# Patient Record
Sex: Female | Born: 1960 | Race: Black or African American | Hispanic: No | Smoking: Current every day smoker
Health system: Southern US, Community
[De-identification: ages and names within clinical notes are randomized; demographics above are authoritative.]

## PROBLEM LIST (undated history)

## (undated) DIAGNOSIS — T7840XA Allergy, unspecified, initial encounter: Secondary | ICD-10-CM

## (undated) DIAGNOSIS — E785 Hyperlipidemia, unspecified: Secondary | ICD-10-CM

## (undated) DIAGNOSIS — E119 Type 2 diabetes mellitus without complications: Secondary | ICD-10-CM

## (undated) DIAGNOSIS — K859 Acute pancreatitis without necrosis or infection, unspecified: Secondary | ICD-10-CM

## (undated) DIAGNOSIS — I10 Essential (primary) hypertension: Secondary | ICD-10-CM

## (undated) HISTORY — PX: BREAST BIOPSY: SHX20

## (undated) HISTORY — DX: Allergy, unspecified, initial encounter: T78.40XA

---

## 2007-02-19 HISTORY — PX: BREAST BIOPSY: SHX20

## 2013-03-11 ENCOUNTER — Ambulatory Visit: Payer: Self-pay | Admitting: Family Medicine

## 2014-07-18 ENCOUNTER — Emergency Department
Admission: EM | Admit: 2014-07-18 | Discharge: 2014-07-18 | Disposition: A | Discharge status: Home / Self Care | Payer: BLUE CROSS/BLUE SHIELD | Attending: Emergency Medicine | Admitting: Emergency Medicine

## 2014-07-18 ENCOUNTER — Encounter: Payer: Self-pay | Admitting: Emergency Medicine

## 2014-07-18 DIAGNOSIS — M545 Low back pain: Secondary | ICD-10-CM | POA: Diagnosis present

## 2014-07-18 DIAGNOSIS — Z794 Long term (current) use of insulin: Secondary | ICD-10-CM | POA: Insufficient documentation

## 2014-07-18 DIAGNOSIS — Z7982 Long term (current) use of aspirin: Secondary | ICD-10-CM | POA: Diagnosis not present

## 2014-07-18 DIAGNOSIS — M5416 Radiculopathy, lumbar region: Secondary | ICD-10-CM | POA: Diagnosis not present

## 2014-07-18 DIAGNOSIS — Z79899 Other long term (current) drug therapy: Secondary | ICD-10-CM | POA: Diagnosis not present

## 2014-07-18 DIAGNOSIS — E119 Type 2 diabetes mellitus without complications: Secondary | ICD-10-CM | POA: Diagnosis not present

## 2014-07-18 DIAGNOSIS — M5410 Radiculopathy, site unspecified: Secondary | ICD-10-CM

## 2014-07-18 HISTORY — DX: Type 2 diabetes mellitus without complications: E11.9

## 2014-07-18 MED ORDER — TRAMADOL HCL 50 MG PO TABS
50.0000 mg | ORAL_TABLET | Freq: Four times a day (QID) | ORAL | Status: DC | PRN
Start: 2014-07-18 — End: 2018-10-28

## 2014-07-18 MED ORDER — METHYLPREDNISOLONE 4 MG PO TBPK: ORAL_TABLET | ORAL | Status: DC

## 2014-07-18 MED ORDER — METHOCARBAMOL 750 MG PO TABS: 1500.0000 mg | ORAL_TABLET | Freq: Four times a day (QID) | ORAL | Status: DC

## 2014-07-18 NOTE — ED Notes (Signed)
Pt reports that she was stretching on Thursday and has had lower back pain ever since. 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 She states pain is a spasm and that it shoots down her right leg.

## 2014-07-18 NOTE — ED Provider Notes (Signed)
[facility] Emergency Department Provider Note  ____________________________________________  Time seen: Approximately 9:12 AM  I have reviewed the triage vital signs and the nursing notes.   HISTORY  Chief Complaint Back Pain    HPI Felicia Sherman is a 54 y.o. female plan of low back pain for 4 days. Patient states she was doing some stretching movements 4 days ago and has low back pain ever since. 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 Patient stated this pain shooting down her right leg. Patient is rating her pain as a 10 over 10. Patient denies any bowel or bladder dysfunction.   Past Medical History  Diagnosis Date  . Diabetes mellitus without complication     There are no active problems to display for this patient.   Past Surgical History  Procedure Laterality Date  . Cesarean section      X 2    Current Outpatient Rx  Name  Route  Sig  Dispense  Refill  . aspirin 81 MG tablet   Oral   Take 81 mg by mouth daily.         . cloNIDine (CATAPRES) 0.1 MG tablet   Oral   Take 0.1 mg by mouth 2 (two) times daily.         . insulin detemir (LEVEMIR) 100 UNIT/ML injection   Subcutaneous   Inject 40 Units into the skin at bedtime.         Marland Kitchen. lisinopril (PRINIVIL,ZESTRIL) 5 MG tablet   Oral   Take 5 mg by mouth daily.         . metFORMIN (GLUCOPHAGE) 1000 MG tablet   Oral   Take 1,000 mg by mouth 2 (two) times daily with a meal.           Allergies Review of patient's allergies indicates no known allergies.  No family history on file.  Social History History  Substance Use Topics  . Smoking status: Never Smoker   . Smokeless tobacco: Not on file  . Alcohol Use: No    Review of Systems Constitutional: No fever/chills Eyes: No visual changes. ENT: No sore throat. Cardiovascular: Denies chest pain. Respiratory: Denies shortness of breath. Gastrointestinal: No  abdominal pain.  No nausea, no vomiting.  No diarrhea.  No constipation. Genitourinary: Negative for dysuria. Musculoskeletal: Positive for back pain with pleuritic component to the right lower extremity. Skin: Negative for rash. Neurological: Negative for headaches. States pain radiates to her right lower leg. 10-point ROS otherwise negative.  ____________________________________________   PHYSICAL EXAM:  VITAL SIGNS: ED Triage Vitals  Enc Vitals Group     BP 07/18/14 0758 106/51 mmHg     Pulse Rate 07/18/14 0758 81     Resp 07/18/14 0758 18     Temp 07/18/14 0758 98.1 F (36.7 C)     Temp Source 07/18/14 0758 Oral     SpO2 07/18/14 0758 99 %     Weight 07/18/14 0758 225 lb (102.059 kg)     Height 07/18/14 0758 5\' 1"  (1.549 m)     Head Cir --      Peak Flow --      Pain Score 07/18/14 0758 10     Pain Loc --      Pain Edu? --      Excl. in GC? --     Constitutional: Alert and oriented. Well appearing and in no acute distress. The patient  is in a wheelchair stating prolonged walking increases her back pain. Eyes: Conjunctivae are normal. PERRL. EOMI. Head: Atraumatic. Nose: No congestion/rhinnorhea. Mouth/Throat: Mucous membranes are moist.  Oropharynx non-erythematous. Neck: No stridor. No deformity for nuchal range of motion of the neck is nontender palpation. Hematological/Lymphatic/Immunilogical: No cervical lymphadenopathy. Cardiovascular: Normal rate, regular rhythm. Grossly normal heart sounds.  Good peripheral circulation. Respiratory: Normal respiratory effort.  No retractions. Lungs CTAB. Gastrointestinal: Soft and nontender. No distention. No abdominal bruits. No CVA tenderness. Musculoskeletal: History lives on upper extremity for significant standing. There is no spinal deformities. Patient has some moderate guarding between L3 and S1. There was decreased range of motion in all fields. There is a negative straight-leg test. Neurologic:  Normal speech and  language. No gross focal neurologic deficits are appreciated. Speech is normal. No gait instability. Skin:  Skin is warm, dry and intact. No rash noted. Psychiatric: Mood and affect are normal. Speech and behavior are normal.  ____________________________________________   LABS (all labs ordered are listed, but only abnormal results are displayed)  Labs Reviewed - No data to display ____________________________________________  EKG   ____________________________________________  RADIOLOGY   ____________________________________________   PROCEDURES  Procedure(s) performed: None  Critical Care performed: No  ____________________________________________   INITIAL IMPRESSION / ASSESSMENT AND PLAN / ED COURSE  Pertinent labs & imaging results that were available during my care of the patient were reviewed by me and considered in my medical decision making (see chart for details).  Radicular low back pain.   FINAL CLINICAL IMPRESSION(S) / ED DIAGNOSES  Final diagnoses:  Back pain with right-sided radiculopathy      Joni Reining, PA-C 07/18/14 1610  Phineas Semen, MD 07/18/14 [id number]

## 2014-07-18 NOTE — Discharge Instructions (Signed)
Back Pain, Adult Back pain is very common. The pain often gets better over time. The cause of back pain is usually not dangerous. Most people can learn to manage their back pain on their own.  Closely monitor blood sugar while taking Prednisone. HOME CARE   Stay active. Start with short walks on flat ground if you can. Try to walk farther each day.  Do not sit, drive, or stand in one place for more than 30 minutes. Do not stay in bed.  Do not avoid exercise or work. Activity can help your back heal faster.  Be careful when you bend or lift an object. Bend at your knees, keep the object close to you, and do not twist.  Sleep on a firm mattress. Lie on your side, and bend your knees. If you lie on your back, put a pillow under your knees.  Only take medicines as told by your doctor.  Put ice on the injured area.  Put ice in a plastic bag.  Place a towel between your skin and the bag.  Leave the ice on for 15-20 minutes, 03-04 times a day for the first 2 to 3 days. After that, you can switch between ice and heat packs.  Ask your doctor about back exercises or massage.  Avoid feeling anxious or stressed. Find good ways to deal with stress, such as exercise. GET HELP RIGHT AWAY IF:   Your pain does not go away with rest or medicine.  Your pain does not go away in 1 week.  You have new problems.  You do not feel well.  The pain spreads into your legs.  You cannot control when you poop (bowel movement) or pee (urinate).  Your arms or legs feel weak or lose feeling (numbness).  You feel sick to your stomach (nauseous) or throw up (vomit).  You have belly (abdominal) pain.  You feel like you may pass out (faint). MAKE SURE YOU:   Understand these instructions.  Will watch your condition.  Will get help right away if you are not doing well or get worse. Document Released: 07/24/2007 Document Revised: 04/29/2011 Document Reviewed: 06/08/2013 [facility] Patient Information  2015 [employer] [place]. This information is not intended to replace advice given to you by your health care provider. Make sure you discuss any questions you have with your health care provider.

## 2014-09-29 ENCOUNTER — Other Ambulatory Visit: Payer: Self-pay | Admitting: Family Medicine

## 2014-09-29 ENCOUNTER — Other Ambulatory Visit: Payer: Self-pay | Admitting: Internal Medicine

## 2014-09-29 DIAGNOSIS — Z1231 Encounter for screening mammogram for malignant neoplasm of breast: Secondary | ICD-10-CM

## 2014-10-11 ENCOUNTER — Ambulatory Visit
Admission: RE | Admit: 2014-10-11 | Discharge: 2014-10-11 | Disposition: A | Discharge status: Home / Self Care | Payer: BLUE CROSS/BLUE SHIELD | Source: Ambulatory Visit | Attending: Family Medicine | Admitting: Family Medicine

## 2014-10-11 DIAGNOSIS — R921 Mammographic calcification found on diagnostic imaging of breast: Secondary | ICD-10-CM | POA: Insufficient documentation

## 2014-10-11 DIAGNOSIS — Z1231 Encounter for screening mammogram for malignant neoplasm of breast: Secondary | ICD-10-CM | POA: Diagnosis not present

## 2015-10-02 ENCOUNTER — Other Ambulatory Visit: Payer: Self-pay | Admitting: Family Medicine

## 2015-10-02 DIAGNOSIS — Z1231 Encounter for screening mammogram for malignant neoplasm of breast: Secondary | ICD-10-CM

## 2015-10-13 ENCOUNTER — Other Ambulatory Visit: Payer: Self-pay | Admitting: Family Medicine

## 2015-10-13 ENCOUNTER — Ambulatory Visit
Admission: RE | Admit: 2015-10-13 | Discharge: 2015-10-13 | Disposition: A | Discharge status: Home / Self Care | Payer: 59 | Source: Ambulatory Visit | Attending: Family Medicine | Admitting: Family Medicine

## 2015-10-13 DIAGNOSIS — Z1231 Encounter for screening mammogram for malignant neoplasm of breast: Secondary | ICD-10-CM | POA: Diagnosis not present

## 2015-10-13 DIAGNOSIS — R928 Other abnormal and inconclusive findings on diagnostic imaging of breast: Secondary | ICD-10-CM | POA: Diagnosis not present

## 2015-10-19 ENCOUNTER — Other Ambulatory Visit: Payer: Self-pay | Admitting: Family Medicine

## 2015-10-20 ENCOUNTER — Other Ambulatory Visit: Payer: Self-pay | Admitting: Family Medicine

## 2015-10-20 DIAGNOSIS — R928 Other abnormal and inconclusive findings on diagnostic imaging of breast: Secondary | ICD-10-CM

## 2015-10-20 DIAGNOSIS — R921 Mammographic calcification found on diagnostic imaging of breast: Secondary | ICD-10-CM

## 2015-11-07 ENCOUNTER — Ambulatory Visit
Admission: RE | Admit: 2015-11-07 | Discharge: 2015-11-07 | Disposition: A | Discharge status: Home / Self Care | Payer: 59 | Source: Ambulatory Visit | Attending: Family Medicine | Admitting: Family Medicine

## 2015-11-07 DIAGNOSIS — R928 Other abnormal and inconclusive findings on diagnostic imaging of breast: Secondary | ICD-10-CM | POA: Diagnosis present

## 2015-11-07 DIAGNOSIS — R921 Mammographic calcification found on diagnostic imaging of breast: Secondary | ICD-10-CM | POA: Insufficient documentation

## 2015-11-16 ENCOUNTER — Other Ambulatory Visit: Payer: Self-pay | Admitting: Family Medicine

## 2015-11-16 DIAGNOSIS — R921 Mammographic calcification found on diagnostic imaging of breast: Secondary | ICD-10-CM

## 2015-11-23 ENCOUNTER — Ambulatory Visit
Admission: RE | Admit: 2015-11-23 | Discharge: 2015-11-23 | Disposition: A | Discharge status: Home / Self Care | Payer: 59 | Source: Ambulatory Visit | Attending: Family Medicine | Admitting: Family Medicine

## 2015-11-23 DIAGNOSIS — R921 Mammographic calcification found on diagnostic imaging of breast: Secondary | ICD-10-CM | POA: Diagnosis present

## 2015-11-23 DIAGNOSIS — R920 Mammographic microcalcification found on diagnostic imaging of breast: Secondary | ICD-10-CM | POA: Diagnosis not present

## 2015-11-23 HISTORY — PX: BREAST BIOPSY: SHX20

## 2015-11-24 LAB — SURGICAL PATHOLOGY

## 2016-08-23 ENCOUNTER — Emergency Department
Admission: EM | Admit: 2016-08-23 | Discharge: 2016-08-23 | Disposition: A | Discharge status: Home / Self Care | Payer: Commercial Managed Care - HMO | Attending: Emergency Medicine | Admitting: Emergency Medicine

## 2016-08-23 ENCOUNTER — Emergency Department: Payer: Commercial Managed Care - HMO

## 2016-08-23 ENCOUNTER — Encounter: Payer: Self-pay | Admitting: Medical Oncology

## 2016-08-23 DIAGNOSIS — Z7984 Long term (current) use of oral hypoglycemic drugs: Secondary | ICD-10-CM | POA: Insufficient documentation

## 2016-08-23 DIAGNOSIS — Z79899 Other long term (current) drug therapy: Secondary | ICD-10-CM | POA: Diagnosis not present

## 2016-08-23 DIAGNOSIS — Z794 Long term (current) use of insulin: Secondary | ICD-10-CM | POA: Insufficient documentation

## 2016-08-23 DIAGNOSIS — Z7982 Long term (current) use of aspirin: Secondary | ICD-10-CM | POA: Diagnosis not present

## 2016-08-23 DIAGNOSIS — R1013 Epigastric pain: Secondary | ICD-10-CM | POA: Diagnosis present

## 2016-08-23 DIAGNOSIS — K8500 Idiopathic acute pancreatitis without necrosis or infection: Secondary | ICD-10-CM | POA: Diagnosis not present

## 2016-08-23 DIAGNOSIS — E119 Type 2 diabetes mellitus without complications: Secondary | ICD-10-CM | POA: Insufficient documentation

## 2016-08-23 DIAGNOSIS — R112 Nausea with vomiting, unspecified: Secondary | ICD-10-CM | POA: Diagnosis not present

## 2016-08-23 DIAGNOSIS — I10 Essential (primary) hypertension: Secondary | ICD-10-CM | POA: Insufficient documentation

## 2016-08-23 HISTORY — DX: Hyperlipidemia, unspecified: E78.5

## 2016-08-23 HISTORY — DX: Essential (primary) hypertension: I10

## 2016-08-23 HISTORY — DX: Acute pancreatitis without necrosis or infection, unspecified: K85.90

## 2016-08-23 LAB — COMPREHENSIVE METABOLIC PANEL
ALT: 18 U/L (ref 14–54)
ANION GAP: 9 (ref 5–15)
AST: 28 U/L (ref 15–41)
Albumin: 4.3 g/dL (ref 3.5–5.0)
Alkaline Phosphatase: 73 U/L (ref 38–126)
BILIRUBIN TOTAL: 0.9 mg/dL (ref 0.3–1.2)
BUN: 17 mg/dL (ref 6–20)
CO2: 25 mmol/L (ref 22–32)
Calcium: 10.1 mg/dL (ref 8.9–10.3)
Chloride: 104 mmol/L (ref 101–111)
Creatinine, Ser: 1.1 mg/dL — ABNORMAL HIGH (ref 0.44–1.00)
GFR, EST NON AFRICAN AMERICAN: 55 mL/min — AB (ref >60)
Glucose, Bld: 93 mg/dL (ref 65–99)
POTASSIUM: 4.4 mmol/L (ref 3.5–5.1)
Sodium: 138 mmol/L (ref 135–145)
TOTAL PROTEIN: 8.1 g/dL (ref 6.5–8.1)

## 2016-08-23 LAB — URINALYSIS, COMPLETE (UACMP) WITH MICROSCOPIC
BACTERIA UA: NONE SEEN
Bilirubin Urine: NEGATIVE
GLUCOSE, UA: NEGATIVE mg/dL
HGB URINE DIPSTICK: NEGATIVE
Ketones, ur: 5 mg/dL — AB
LEUKOCYTES UA: NEGATIVE
NITRITE: NEGATIVE
Protein, ur: 100 mg/dL — AB
SPECIFIC GRAVITY, URINE: 1.024 (ref 1.005–1.030)
pH: 5 (ref 5.0–8.0)

## 2016-08-23 LAB — CBC
HCT: 42.5 % (ref 35.0–47.0)
Hemoglobin: 14.3 g/dL (ref 12.0–16.0)
MCH: 29.3 pg (ref 26.0–34.0)
MCHC: 33.6 g/dL (ref 32.0–36.0)
MCV: 87.1 fL (ref 80.0–100.0)
PLATELETS: 272 10*3/uL (ref 150–440)
RBC: 4.87 MIL/uL (ref 3.80–5.20)
RDW: 14.2 % (ref 11.5–14.5)
WBC: 8.9 10*3/uL (ref 3.6–11.0)

## 2016-08-23 LAB — LIPASE, BLOOD: LIPASE: 39 U/L (ref 11–51)

## 2016-08-23 LAB — TROPONIN I: Troponin I: <0.03 ng/mL (ref <0.03)

## 2016-08-23 MED ORDER — IOPAMIDOL (ISOVUE-300) INJECTION 61%
30.0000 mL | Freq: Once | INTRAVENOUS | Status: AC | PRN
  Administered 2016-08-23: 30 mL via ORAL

## 2016-08-23 MED ORDER — MORPHINE SULFATE (PF) 4 MG/ML IV SOLN
4.0000 mg | Freq: Once | INTRAVENOUS | Status: AC
  Administered 2016-08-23: 4 mg via INTRAVENOUS
  Filled 2016-08-23: qty 1

## 2016-08-23 MED ORDER — ONDANSETRON 4 MG PO TBDP: 4.0000 mg | ORAL_TABLET | Freq: Three times a day (TID) | ORAL | 0 refills | Status: AC | PRN

## 2016-08-23 MED ORDER — IOPAMIDOL (ISOVUE-300) INJECTION 61%
100.0000 mL | Freq: Once | INTRAVENOUS | Status: AC | PRN
  Administered 2016-08-23: 100 mL via INTRAVENOUS

## 2016-08-23 MED ORDER — SODIUM CHLORIDE 0.9 % IV BOLUS (SEPSIS)
1000.0000 mL | Freq: Once | INTRAVENOUS | Status: AC
  Administered 2016-08-23: 1000 mL via INTRAVENOUS

## 2016-08-23 NOTE — ED Provider Notes (Signed)
[facility] Emergency Department Provider Note  ____________________________________________  Time seen: Approximately 1:16 PM  I have reviewed the triage vital signs and the nursing notes.   HISTORY  Chief Complaint Abdominal Pain and Emesis    HPI Felicia Sherman is a 56 y.o. female who complains of epigastric pain radiating through to her back for the past 3 days associated with vomiting, worse with eating. No alleviating factors. Rated as severe and sharp. Feels just like pancreatitis that she's had in the past. She thinks it is because recently for the July 4 holiday she went to a cookout and ate lots of high-fat foods.     Past Medical History:  Diagnosis Date  . Diabetes mellitus without complication (HCC)   . Hyperlipemia   . Hypertension   . Pancreatitis      There are no active problems to display for this patient.    Past Surgical History:  Procedure Laterality Date  . BREAST BIOPSY Left    2009 benign  . BREAST BIOPSY Right    unknown  . CESAREAN SECTION     X 2     Prior to Admission medications   Medication Sig Start Date End Date Taking? Authorizing Provider  aspirin 81 MG tablet Take 81 mg by mouth daily.   Yes [provider]  cloNIDine (CATAPRES) 0.1 MG tablet Take 0.1 mg by mouth 2 (two) times daily.   Yes [provider]  insulin aspart (NOVOLOG) 100 UNIT/ML injection Inject 10-16 Units into the skin 3 (three) times daily before meals.   Yes [provider]  insulin detemir (LEVEMIR) 100 UNIT/ML injection Inject 45 Units into the skin at bedtime.    Yes [provider]  lisinopril (PRINIVIL,ZESTRIL) 5 MG tablet Take 5 mg by mouth daily.   Yes [provider]  metFORMIN (GLUCOPHAGE) 1000 MG tablet Take 1,000 mg by mouth 2 (two) times daily with a meal.   Yes [provider]  methocarbamol (ROBAXIN-750) 750 MG tablet Take 2 tablets (1,500 mg total) by mouth 4 (four)  times daily. Patient not taking: Reported on 08/23/2016 07/18/14   Joni Reining, PA-C  methylPREDNISolone (MEDROL DOSEPAK) 4 MG TBPK tablet Take Tapered dose as directed Patient not taking: Reported on 08/23/2016 07/18/14   Joni Reining, PA-C  ondansetron (ZOFRAN ODT) 4 MG disintegrating tablet Take 1 tablet (4 mg total) by mouth every 8 (eight) hours as needed for nausea or vomiting. 08/23/16   Sharman Cheek, MD  traMADol (ULTRAM) 50 MG tablet Take 1 tablet (50 mg total) by mouth every 6 (six) hours as needed for moderate pain. Patient not taking: Reported on 08/23/2016 07/18/14   Joni Reining, PA-C     Allergies Patient has no known allergies.   Family History  Problem Relation Age of Onset  . Breast cancer Maternal Aunt   . Breast cancer Paternal Aunt     Social History Social History  Substance Use Topics  . Smoking status: Never Smoker  . Smokeless tobacco: Not on file  . Alcohol use No    Review of Systems  Constitutional:   No fever or chills.  ENT:   No sore throat. No rhinorrhea. Cardiovascular:   No chest pain or syncope. Respiratory:   No dyspnea or cough. Gastrointestinal:   Positive as above for abdominal pain with vomiting. No constipation or diarrhea..  Musculoskeletal:   Negative for focal pain or swelling All other systems reviewed and are negative except  as documented above in ROS and HPI.  ____________________________________________   PHYSICAL EXAM:  VITAL SIGNS: ED Triage Vitals  Enc Vitals Group     BP 08/23/16 1112 (!) 108/91     Pulse Rate 08/23/16 1112 85     Resp 08/23/16 1112 18     Temp 08/23/16 1112 98.6 F (37 C)     Temp Source 08/23/16 1112 Oral     SpO2 08/23/16 1112 97 %     Weight 08/23/16 1111 225 lb (102.1 kg)     Height --      Head Circumference --      Peak Flow --      Pain Score 08/23/16 1111 8     Pain Loc --      Pain Edu? --      Excl. in GC? --     Vital signs reviewed, nursing assessments  reviewed.   Constitutional:   Alert and oriented. Well appearing and in no distress. Eyes:   No scleral icterus.  EOMI. No nystagmus. No conjunctival pallor. PERRL. ENT   Head:   Normocephalic and atraumatic.   Nose:   No congestion/rhinnorhea.    Mouth/Throat:   MMM, no pharyngeal erythema. No peritonsillar mass.    Neck:   No meningismus. Full ROM Hematological/Lymphatic/Immunilogical:   No cervical lymphadenopathy. Cardiovascular:   RRR. Symmetric bilateral radial and DP pulses.  No murmurs.  Respiratory:   Normal respiratory effort without tachypnea/retractions. Breath sounds are clear and equal bilaterally. No wheezes/rales/rhonchi. Gastrointestinal:   Soft With epigastric tenderness. Non distended. There is no CVA tenderness.  No rebound, rigidity, or guarding. Genitourinary:   deferred Musculoskeletal:   Normal range of motion in all extremities. No joint effusions.  No lower extremity tenderness.  No edema. Neurologic:   Normal speech and language.  Motor grossly intact. No gross focal neurologic deficits are appreciated.  Skin:    Skin is warm, dry and intact. No rash noted.  No petechiae, purpura, or bullae.  ____________________________________________    LABS (pertinent positives/negatives) (all labs ordered are listed, but only abnormal results are displayed) Labs Reviewed  COMPREHENSIVE METABOLIC PANEL - Abnormal; Notable for the following:       Result Value   Creatinine, Ser 1.10 (*)    GFR calc non Af Amer 55 (*)    All other components within normal limits  URINALYSIS, COMPLETE (UACMP) WITH MICROSCOPIC - Abnormal; Notable for the following:    Color, Urine AMBER (*)    APPearance CLOUDY (*)    Ketones, ur 5 (*)    Protein, ur 100 (*)    Squamous Epithelial / LPF 6-30 (*)    All other components within normal limits  LIPASE, BLOOD  CBC  TROPONIN I   ____________________________________________   EKG  Interpreted by me  Date: 08/23/2016   Rate: 71  Rhythm: normal sinus rhythm  QRS Axis: normal  Intervals: normal  ST/T Wave abnormalities: normal  Conduction Disutrbances: none  Narrative Interpretation: unremarkable      ____________________________________________    RADIOLOGY  Ct Abdomen Pelvis W Contrast  Result Date: 08/23/2016 CLINICAL DATA:  Upper abdominal pain for 3 days with vomiting. History of pancreatitis. EXAM: CT ABDOMEN AND PELVIS WITH CONTRAST TECHNIQUE: Multidetector CT imaging of the abdomen and pelvis was performed using the standard protocol following bolus administration of intravenous contrast. CONTRAST:  100mL ISOVUE-300 IOPAMIDOL (ISOVUE-300) INJECTION 61% COMPARISON:  None. FINDINGS: Lower chest: No acute abnormality. Hepatobiliary: No focal liver abnormality is seen.  No gallbladder wall thickening, or biliary dilatation. Probable gallbladder sludge. Pancreas: Futureless masslike appearance of the head of the pancreas, with small amount of peripancreatic fluid. No evidence of pancreatic ductal dilation. Spleen: Normal in size without focal abnormality. Adrenals/Urinary Tract: Adrenal glands are unremarkable. Kidneys are normal, without renal calculi, focal lesion, or hydronephrosis. Bladder is unremarkable. Stomach/Bowel: Stomach is within normal limits. Appendix appears normal. No evidence of bowel wall thickening, distention, or inflammatory changes. Scatter left colonic diverticulosis. Vascular/Lymphatic: Partially calcified soft tissue mass within the right retroperitoneum measures 1.4 by 1.3 by 1.9 cm. Aortic atherosclerosis. Reproductive: Uterus and bilateral adnexa are unremarkable. Other: Fat containing periumbilical anterior abdominal wall hernia. No abdominopelvic ascites. Musculoskeletal: Posterior facet arthropathy in the lower lumbosacral spine. IMPRESSION: Futureless masslike appearance of the head of the pancreas with small amount of peripancreatic fluid. The findings are most consistent with  acute pancreatitis. However if patient's serology does not support acute pancreatitis, pancreatic carcinoma should be considered. Partially calcified soft tissue mass within the right retroperitoneum measuring 1.9 cm in greatest dimension. This may represent a calcified retroperitoneal lymph node, or it may represent a partially calcified mesenteric mass, the differential diagnosis of which includes calcific metastatic implant, carcinoid tumor, chronic sclerosing mesenteritis or amyloidosis. Probable gallbladder sludge. Calcific atherosclerotic disease of the aorta. Electronically Signed   By: Ted Mcalpine M.D.   On: 08/23/2016 14:07    ____________________________________________   PROCEDURES Procedures  ____________________________________________   INITIAL IMPRESSION / ASSESSMENT AND PLAN / ED COURSE  Pertinent labs & imaging results that were available during my care of the patient were reviewed by me and considered in my medical decision making (see chart for details).  Patient presents with epigastric pain concerning for pancreatitis. Exam is not consistent with biliary pathology. Low suspicion for SBO or obstruction. Start with labs IV fluids pain control.  Clinical Course as of Aug 24 1515  Fri Aug 23, 2016  1234 Labs nl. Will get CT.  [PS]  1320 Pt reports symptoms much improved, but exam still shows epigastric tenderness. Awaiting CT.   [PS]    Clinical Course User Index [PS] Sharman Cheek, MD      ----------------------------------------- 3:17 PM on 08/23/2016 -----------------------------------------  CT does show some inflammatory changes at the head of the pancreas consistent with pancreatitis. Small possibility that this reflects malignancy, and there is a second unspecified masslike object in the right retroperitoneum. Results discussed with the patient, offered admission, but patient feels better and wants to go home. She is tolerating oral intake. Plan  to follow closely with primary care and also plan to follow up with gastroenterology for further evaluation of symptoms and CT findings once this acute pancreatitis episode has resolved. ____________________________________________   FINAL CLINICAL IMPRESSION(S) / ED DIAGNOSES  Final diagnoses:  Epigastric pain  Non-intractable vomiting with nausea, unspecified vomiting type  Idiopathic acute pancreatitis, unspecified complication status      New Prescriptions   ONDANSETRON (ZOFRAN ODT) 4 MG DISINTEGRATING TABLET    Take 1 tablet (4 mg total) by mouth every 8 (eight) hours as needed for nausea or vomiting.     Portions of this note were generated with dragon dictation software. Dictation errors may occur despite best attempts at proofreading.    Sharman Cheek, MD 08/23/16 [phone]

## 2016-08-23 NOTE — Discharge Instructions (Signed)
Results for orders placed or performed during the hospital encounter of 08/23/16  Lipase, blood  Result Value Ref Range   Lipase 39 11 - 51 U/L  Comprehensive metabolic panel  Result Value Ref Range   Sodium 138 135 - 145 mmol/L   Potassium 4.4 3.5 - 5.1 mmol/L   Chloride 104 101 - 111 mmol/L   CO2 25 22 - 32 mmol/L   Glucose, Bld 93 65 - 99 mg/dL   BUN 17 6 - 20 mg/dL   Creatinine, Ser 8.29 (H) 0.44 - 1.00 mg/dL   Calcium 56.2 8.9 - 13.0 mg/dL   Total Protein 8.1 6.5 - 8.1 g/dL   Albumin 4.3 3.5 - 5.0 g/dL   AST 28 15 - 41 U/L   ALT 18 14 - 54 U/L   Alkaline Phosphatase 73 38 - 126 U/L   Total Bilirubin 0.9 0.3 - 1.2 mg/dL   GFR calc non Af Amer 55 (L) >60 mL/min   GFR calc Af Amer >60 >60 mL/min   Anion gap 9 5 - 15  CBC  Result Value Ref Range   WBC 8.9 3.6 - 11.0 K/uL   RBC 4.87 3.80 - 5.20 MIL/uL   Hemoglobin 14.3 12.0 - 16.0 g/dL   HCT 86.5 78.4 - 69.6 %   MCV 87.1 80.0 - 100.0 fL   MCH 29.3 26.0 - 34.0 pg   MCHC 33.6 32.0 - 36.0 g/dL   RDW 29.5 28.4 - 13.2 %   Platelets 272 150 - 440 K/uL  Urinalysis, Complete w Microscopic  Result Value Ref Range   Color, Urine AMBER (A) YELLOW   APPearance CLOUDY (A) CLEAR   Specific Gravity, Urine 1.024 1.005 - 1.030   pH 5.0 5.0 - 8.0   Glucose, UA NEGATIVE NEGATIVE mg/dL   Hgb urine dipstick NEGATIVE NEGATIVE   Bilirubin Urine NEGATIVE NEGATIVE   Ketones, ur 5 (A) NEGATIVE mg/dL   Protein, ur 440 (A) NEGATIVE mg/dL   Nitrite NEGATIVE NEGATIVE   Leukocytes, UA NEGATIVE NEGATIVE   RBC / HPF 0-5 0 - 5 RBC/hpf   WBC, UA 0-5 0 - 5 WBC/hpf   Bacteria, UA NONE SEEN NONE SEEN   Squamous Epithelial / LPF 6-30 (A) NONE SEEN   Mucous PRESENT    Hyaline Casts, UA PRESENT   Troponin I  Result Value Ref Range   Troponin I <0.03 <0.03 ng/mL   Ct Abdomen Pelvis W Contrast  Result Date: 08/23/2016 CLINICAL DATA:  Upper abdominal pain for 3 days with vomiting. History of pancreatitis. EXAM: CT ABDOMEN AND PELVIS WITH CONTRAST  TECHNIQUE: Multidetector CT imaging of the abdomen and pelvis was performed using the standard protocol following bolus administration of intravenous contrast. CONTRAST:  ISOVUE-300 IOPAMIDOL (ISOVUE-300) INJECTION 61% COMPARISON:  None. FINDINGS: Lower chest: No acute abnormality. Hepatobiliary: No focal liver abnormality is seen. No gallbladder wall thickening, or biliary dilatation. Probable gallbladder sludge. Pancreas: Futureless masslike appearance of the head of the pancreas, with small amount of peripancreatic fluid. No evidence of pancreatic ductal dilation. Spleen: Normal in size without focal abnormality. Adrenals/Urinary Tract: Adrenal glands are unremarkable. Kidneys are normal, without renal calculi, focal lesion, or hydronephrosis. Bladder is unremarkable. Stomach/Bowel: Stomach is within normal limits. Appendix appears normal. No evidence of bowel wall thickening, distention, or inflammatory changes. Scatter left colonic diverticulosis. Vascular/Lymphatic: Partially calcified soft tissue mass within the right retroperitoneum measures 1.4 by 1.3 by 1.9 cm. Aortic atherosclerosis. Reproductive: Uterus and bilateral adnexa are unremarkable. Other: Fat containing  periumbilical anterior abdominal wall hernia. No abdominopelvic ascites. Musculoskeletal: Posterior facet arthropathy in the lower lumbosacral spine. IMPRESSION: Futureless masslike appearance of the head of the pancreas with small amount of peripancreatic fluid. The findings are most consistent with acute pancreatitis. However if patient's serology does not support acute pancreatitis, pancreatic carcinoma should be considered. Partially calcified soft tissue mass within the right retroperitoneum measuring 1.9 cm in greatest dimension. This may represent a calcified retroperitoneal lymph node, or it may represent a partially calcified mesenteric mass, the differential diagnosis of which includes calcific metastatic implant, carcinoid  tumor, chronic sclerosing mesenteritis or amyloidosis. Probable gallbladder sludge. Calcific atherosclerotic disease of the aorta. Electronically Signed   By: Ted Mcalpineobrinka  Dimitrova M.D.   On: 08/23/2016 14:07

## 2016-08-23 NOTE — ED Triage Notes (Signed)
Pt reports upper abd pain x 3 days with vomiting. Pt reports hx of pancreatitis with feeling exactly like that.

## 2016-08-23 NOTE — ED Notes (Signed)
PO challenge given at this time [facility]

## 2016-10-22 ENCOUNTER — Other Ambulatory Visit: Payer: Self-pay | Admitting: Gastroenterology

## 2016-10-22 DIAGNOSIS — R933 Abnormal findings on diagnostic imaging of other parts of digestive tract: Secondary | ICD-10-CM

## 2016-10-22 DIAGNOSIS — K859 Acute pancreatitis without necrosis or infection, unspecified: Secondary | ICD-10-CM

## 2016-11-01 ENCOUNTER — Other Ambulatory Visit: Payer: Self-pay | Admitting: Gastroenterology

## 2016-11-01 ENCOUNTER — Ambulatory Visit
Admission: RE | Admit: 2016-11-01 | Discharge: 2016-11-01 | Disposition: A | Discharge status: Home / Self Care | Payer: 59 | Source: Ambulatory Visit | Attending: Gastroenterology | Admitting: Gastroenterology

## 2016-11-01 DIAGNOSIS — R933 Abnormal findings on diagnostic imaging of other parts of digestive tract: Secondary | ICD-10-CM | POA: Insufficient documentation

## 2016-11-01 DIAGNOSIS — I700 Atherosclerosis of aorta: Secondary | ICD-10-CM | POA: Diagnosis not present

## 2016-11-01 DIAGNOSIS — K858 Other acute pancreatitis without necrosis or infection: Secondary | ICD-10-CM

## 2016-11-01 DIAGNOSIS — K859 Acute pancreatitis without necrosis or infection, unspecified: Secondary | ICD-10-CM

## 2016-11-01 MED ORDER — GADOBENATE DIMEGLUMINE 529 MG/ML IV SOLN
20.0000 mL | Freq: Once | INTRAVENOUS | Status: AC | PRN
  Administered 2016-11-01: 20 mL via INTRAVENOUS

## 2017-03-25 ENCOUNTER — Other Ambulatory Visit: Payer: Self-pay | Admitting: Family Medicine

## 2017-03-25 DIAGNOSIS — Z1231 Encounter for screening mammogram for malignant neoplasm of breast: Secondary | ICD-10-CM

## 2017-05-20 ENCOUNTER — Ambulatory Visit
Admission: RE | Admit: 2017-05-20 | Discharge: 2017-05-20 | Disposition: A | Discharge status: Home / Self Care | Payer: 59 | Source: Ambulatory Visit | Attending: Family Medicine | Admitting: Family Medicine

## 2017-05-20 DIAGNOSIS — Z1231 Encounter for screening mammogram for malignant neoplasm of breast: Secondary | ICD-10-CM | POA: Diagnosis present

## 2018-01-12 IMAGING — MG MM DIGITAL DIAGNOSTIC UNILAT*R*
3 series · 3 of 3 positions shown · non-contrast
Comparison: Previous exam(s).

ACR Breast Density Category a: The breast tissue is almost entirely
fatty.

CLINICAL DATA: 55-year-old female for evaluation of right breast
calcifications identified on screening mammogram.

EXAM:
DIGITAL DIAGNOSTIC RIGHT MAMMOGRAM WITH CAD

[R ML (1 of 2)]
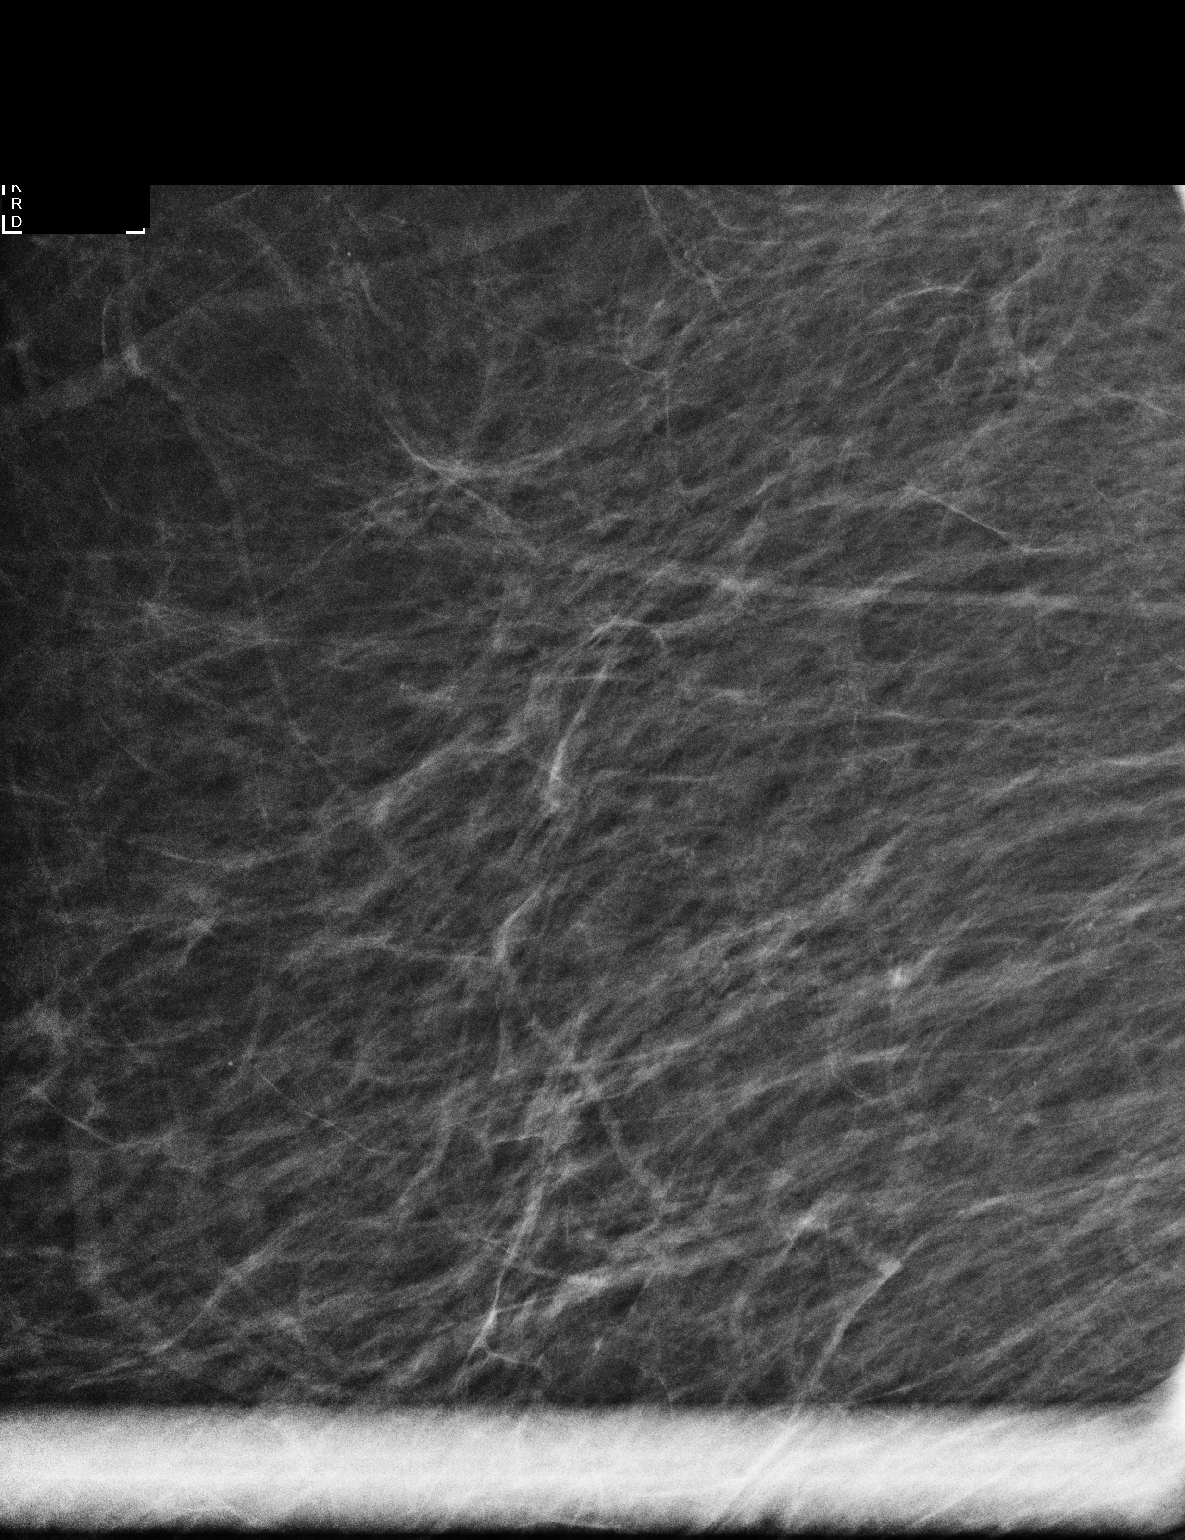

[R CC]
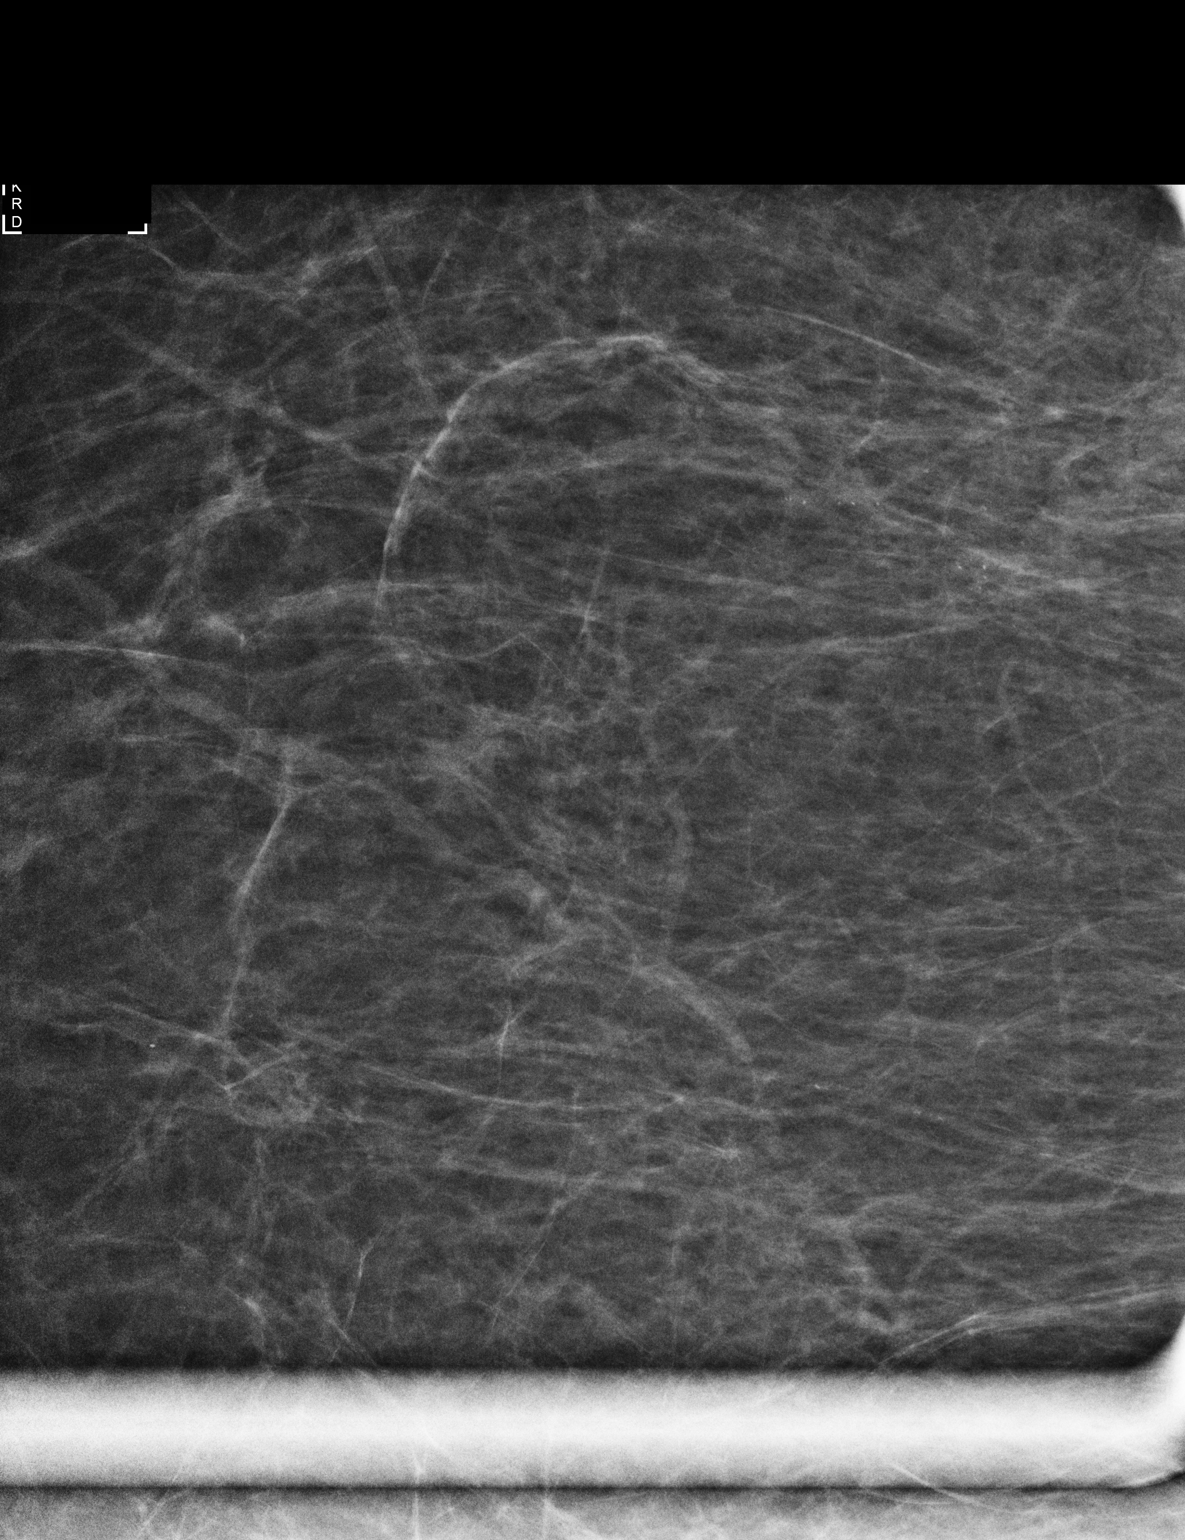

[R ML (2 of 2)]
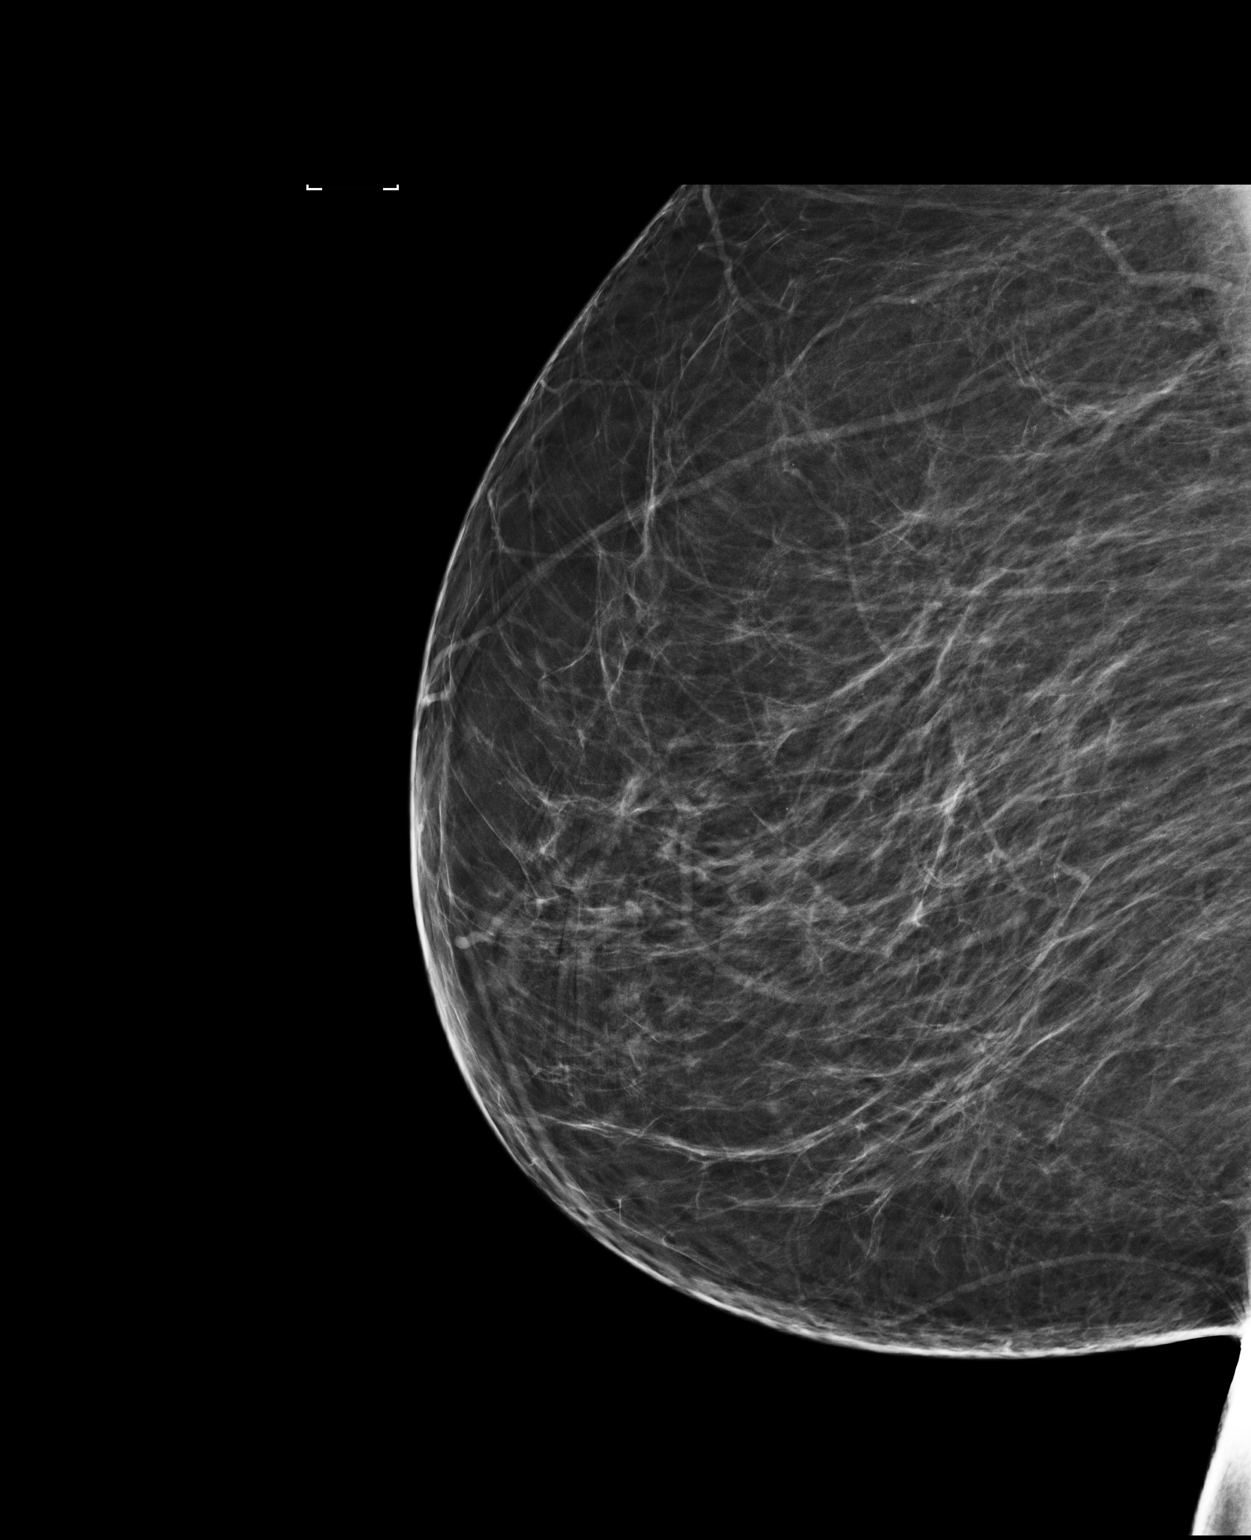

[3 of 3 positions shown; findings below may reference images not displayed]

FINDINGS: Full field lateral and magnification views of the right breast
demonstrate a 2 cm loose group of primarily round calcifications
within the slightly upper outer right breast. Some these
calcifications anteriorly linearly oriented. No associated mass or
distortion identified.

Mammographic images were processed with CAD.
IMPRESSION: Indeterminate right breast calcifications. Tissue sampling
recommended.

RECOMMENDATION:
Stereotactic guided right breast biopsy, which will be arranged.

I have discussed the findings and recommendations with the patient.
Results were also provided in writing at the conclusion of the
visit. If applicable, a reminder letter will be sent to the patient
regarding the next appointment.

BI-RADS CATEGORY  4: Suspicious abnormality - biopsy should be
considered.

## 2018-09-14 ENCOUNTER — Other Ambulatory Visit: Payer: Self-pay | Admitting: Family Medicine

## 2018-09-14 ENCOUNTER — Other Ambulatory Visit: Payer: Self-pay | Admitting: Physical Medicine and Rehabilitation

## 2018-09-14 DIAGNOSIS — Z1231 Encounter for screening mammogram for malignant neoplasm of breast: Secondary | ICD-10-CM

## 2018-10-19 ENCOUNTER — Ambulatory Visit
Admission: RE | Admit: 2018-10-19 | Discharge: 2018-10-19 | Disposition: A | Discharge status: Home / Self Care | Payer: BC Managed Care – PPO | Source: Ambulatory Visit | Attending: Family Medicine | Admitting: Family Medicine

## 2018-10-19 DIAGNOSIS — Z1231 Encounter for screening mammogram for malignant neoplasm of breast: Secondary | ICD-10-CM | POA: Diagnosis not present

## 2018-10-27 DIAGNOSIS — I10 Essential (primary) hypertension: Secondary | ICD-10-CM | POA: Insufficient documentation

## 2018-10-27 DIAGNOSIS — I479 Paroxysmal tachycardia, unspecified: Secondary | ICD-10-CM | POA: Insufficient documentation

## 2018-10-27 NOTE — Progress Notes (Signed)
Cardiology Office Note  Date:  10/28/2018   ID:  Felicia Sherman, DOB 05/10/1960, MRN [id number]  PCP:  Clinic, General Medical   Chief Complaint  Patient presents with  . New Patient (Initial Visit)    referred by PCP for Fast HR. Meds reviewed verbally with patient.     HPI:  Felicia Sherman is a 58 year old woman with past medical history of Pancreatitis Dyspepsia Smoker Diabetes type 2 Referred by Felicia Sherman for consultation of her paroxysmal tachycardia and hypertension  She reports that she has no significant cardiac issues Over the past month though she has had very symptomatic tachycardia Lasting 3 min to 6 min Random times, mostly in the daytime When she has the symptoms has some shortness of breath, has to sit until her symptoms go away, makes her panic  Very stressful several months, lost her son in a motor vehicle accident he was 655 years old he flipped car 3 times  weight down, changed her diet, eating less Weight was 252 pounds  Now 193 pounds Feels good about her weight loss  Works in the medical field, 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  PSH:    Past Surgical History:  Procedure Laterality Date  . BREAST BIOPSY Left    2009 benign  . BREAST BIOPSY Right 11/23/2015   FIBROADENOMATOUS CHANGE WITH MICROCALCIFICATIONS. MICROCALCIFICATIONS IN BENIGN  . CESAREAN SECTION     X 2    Current Outpatient Medications  Medication Sig Dispense Refill  . aspirin 81 MG tablet Take 81 mg by mouth daily.    . cloNIDine (CATAPRES) 0.1 MG tablet Take 0.1 mg by mouth 2 (two) times daily.    . insulin aspart (NOVOLOG) 100 UNIT/ML injection Inject 10-16 Units into the skin 3  (three) times daily before meals.    . insulin detemir (LEVEMIR) 100 UNIT/ML injection Inject 45 Units into the skin at bedtime.     Marland Kitchen. lisinopril (PRINIVIL,ZESTRIL) 5 MG tablet Take 5 mg by mouth daily.    . metFORMIN (GLUCOPHAGE) 1000 MG tablet Take 1,000 mg by mouth 2 (two) times daily with a meal.    . ondansetron (ZOFRAN ODT) 4 MG disintegrating tablet Take 1 tablet (4 mg total) by mouth every 8 (eight) hours as needed for nausea or vomiting. 20 tablet 0   No current facility-administered medications for this visit.      Allergies:   Patient has no known allergies.   Social History:  The patient  reports that she has been smoking. She has been smoking about 0.25 packs per day. She has never used smokeless tobacco. She reports that she does not drink alcohol or use drugs.   Family History:   family history includes Breast cancer in her maternal aunt and paternal aunt.    Review of Systems: Review of Systems  Constitutional: Negative.   HENT: Negative.   Respiratory: Negative.   Cardiovascular: Positive for palpitations.       Tachycardia  Gastrointestinal: Negative.   Musculoskeletal: Negative.   Neurological: Negative.   Psychiatric/Behavioral: Negative.   All other systems reviewed and are negative.   PHYSICAL EXAM: VS:  BP 116/68 (BP Location: Right Arm, Patient Position: Sitting, Cuff Size: Normal)  Pulse 82   Ht 5\' 1"  (1.549 m)   Wt 193 lb (87.5 kg)   BMI 36.47 kg/m  , BMI Body mass index is 36.47 kg/m. GEN: Well nourished, well developed, in no acute distress obese HEENT: normal Neck: no JVD, carotid bruits, or masses Cardiac: RRR; no murmurs, rubs, or gallops,no edema  Respiratory:  clear to auscultation bilaterally, normal work of breathing GI: soft, nontender, nondistended, + BS MS: no deformity or atrophy Skin: warm and dry, no rash Neuro:  Strength and sensation are intact Psych: euthymic mood, full affect   Recent Labs: No results found for  requested labs within last 8760 hours.    Lipid Panel No results found for: CHOL, HDL, LDLCALC, TRIG    Wt Readings from Last 3 Encounters:  10/28/18 193 lb (87.5 kg)  08/23/16 225 lb (102.1 kg)  07/18/14 225 lb (102.1 kg)       ASSESSMENT AND PLAN:  Problem List Items Addressed This Visit      Cardiology Problems   Paroxysmal tachycardia ([place])   Hypertension     Likely having arrhythmia, etiology unclear Unable to exclude atrial tachycardia, SVT, atrial flutter, atrial fibrillation Symptomatic typically lasting less than 6 minutes at a time Unclear trigger though significant stressors over the past several months -Discussed either treating with medications or identifying her rhythm with a monitor She prefers to wear a monitor, identify the rhythm before treatment We have placed a Zio monitor on her today in clinic Directions provided  Morbid obesity Congratulated her on 50 pound weight loss, recommend she continue her aggressive dietary measures She is active at baseline  Diabetes type 2 Hemoglobin A1c typically in the 6 range Likely has dropped significantly with recent weight loss  Numbness in the arm Recommended she use a brace, She does spend significant time on the computer For persistent symptoms may need referral to orthopedics  Smoker We have encouraged her to continue to work on weaning her cigarettes and smoking cessation. She will continue to work on this and does not want any assistance with chantix.   Disposition:   F/U as needed We will call her with the results of the monitor with recommendation for medications and further testing, follow-up   Total encounter time more than 60 minutes  Greater than 50% was spent in counseling and coordination of care with the patient  Patient was referred by Felicia Sherman would be referred back to her office for ongoing care of the issues detailed above  Signed, Esmond Plants, M.D., Ph.D. [place], [place]

## 2018-10-28 ENCOUNTER — Ambulatory Visit (INDEPENDENT_AMBULATORY_CARE_PROVIDER_SITE_OTHER): Payer: BC Managed Care – PPO

## 2018-10-28 ENCOUNTER — Encounter: Payer: Self-pay | Admitting: Cardiovascular Disease

## 2018-10-28 ENCOUNTER — Other Ambulatory Visit: Payer: Self-pay

## 2018-10-28 ENCOUNTER — Ambulatory Visit (INDEPENDENT_AMBULATORY_CARE_PROVIDER_SITE_OTHER): Payer: BC Managed Care – PPO | Admitting: Cardiovascular Disease

## 2018-10-28 DIAGNOSIS — I10 Essential (primary) hypertension: Secondary | ICD-10-CM

## 2018-10-28 DIAGNOSIS — I479 Paroxysmal tachycardia, unspecified: Secondary | ICD-10-CM

## 2018-10-28 NOTE — Patient Instructions (Addendum)
Zio monitor for paroxysmal tachycardia   Medication Instructions:  No changes  If you need a refill on your cardiac medications before your next appointment, please call your pharmacy.    Lab work: No new labs needed   If you have labs (blood work) drawn today and your tests are completely normal, you will receive your results only by: Marland Kitchen MyChart Message (if you have MyChart) OR . A paper copy in the mail If you have any lab test that is abnormal or we need to change your treatment, we will call you to review the results.   Testing/Procedures: Your physician has recommended that you wear a Zio monitor. This monitor is a medical device that records the heart's electrical activity. Doctors most often use these monitors to diagnose arrhythmias. Arrhythmias are problems with the speed or rhythm of the heartbeat. The monitor is a small device applied to your chest. You can wear one while you do your normal daily activities. While wearing this monitor if you have any symptoms to push the button and record what you felt. Once you have worn this monitor for the period of time provider prescribed (Usually 14 days), you will return the monitor device in the postage paid box. Once it is returned they will download the data collected and provide [place] with a report which the provider will then review and we will call you with those results. Important tips:  1. Avoid showering during the first 24 hours of wearing the monitor. 2. Avoid excessive sweating to help maximize wear time. 3. Do not submerge the device, no hot tubs, and no swimming pools. 4. Keep any lotions or oils away from the patch. 5. After 24 hours you may shower with the patch on. Take brief showers with your back facing the shower head.  6. Do not remove patch once it has been placed because that will interrupt data and decrease adhesive wear time. 7. Push the button when you have any symptoms and write down what you were feeling. 8. Once  you have completed wearing your monitor, remove and place into box which has postage paid and place in your outgoing mailbox.  9. If for some reason you have misplaced your box then call our office and we can provide another box and/or mail it off for you.         Follow-Up: At [facility], you and your health needs are our priority.  As part of our continuing mission to provide you with exceptional heart care, we have created designated Provider Care Teams.  These Care Teams include your primary Cardiologist (physician) and Advanced Practice Providers (APPs -  Physician Assistants and Nurse Practitioners) who all work together to provide you with the care you need, when you need it.  . You will need a follow up appointment as needed   . Providers on your designated Care Team:   . Murray Hodgkins, NP . Christell Faith, PA-C . Marrianne Mood, PA-C  Any Other Special Instructions Will Be Listed Below (If Applicable).  For educational health videos Log in to : [email or web address] Or : [email or web address], password : [email or web address]

## 2018-10-29 ENCOUNTER — Other Ambulatory Visit: Payer: Self-pay | Admitting: *Deleted

## 2018-10-29 DIAGNOSIS — I479 Paroxysmal tachycardia, unspecified: Secondary | ICD-10-CM

## 2018-11-03 ENCOUNTER — Telehealth: Payer: Self-pay | Admitting: Cardiovascular Disease

## 2018-11-03 NOTE — Telephone Encounter (Signed)
Spoke with patient about heart monitor that came off. She reports having one episode which she states lasted 2 minutes. She did press button for that and advised to send monitor in and we can see what it shows and then call her back if provider would recommend additional monitor. She was agreeable with this plan and had no further questions at this time. She verbalized understanding of our conversation with no further concerns at this time.

## 2018-11-27 ENCOUNTER — Telehealth: Payer: Self-pay | Admitting: Cardiovascular Disease

## 2018-11-27 NOTE — Telephone Encounter (Signed)
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 Please contact us at [phone] or [email or web address] and advise [place] of one of the following:  1) The data was sufficient and [employer] can move forward with billing  2) The data was not sufficient and [employer] should not move forward with billing, and a replacement is not needed at this time  3) The data was not sufficient and [employer] should not move forward with billing this ZIO and you would like [place] to ship a replacement device to this patient. "   See 11/03/18 phone note.  The patient only wore her monitor for 2 days- the report is uploaded in her chart.

## 2018-11-28 NOTE — Telephone Encounter (Signed)
Would not bill, Need another zio Not enough data

## 2018-11-30 NOTE — Telephone Encounter (Signed)
Made call to [employer] to request they send a replacement and not charge patient. Address confirmed.   Call to patient to make her aware that replacement is being sent. Pt will place monitor at home.   Advised pt to call for any further questions or concerns.

## 2018-12-14 ENCOUNTER — Ambulatory Visit (INDEPENDENT_AMBULATORY_CARE_PROVIDER_SITE_OTHER): Payer: BC Managed Care – PPO

## 2018-12-14 DIAGNOSIS — I479 Paroxysmal tachycardia, unspecified: Secondary | ICD-10-CM | POA: Diagnosis not present

## 2019-01-07 ENCOUNTER — Other Ambulatory Visit: Payer: Self-pay | Admitting: *Deleted

## 2019-01-07 DIAGNOSIS — I479 Paroxysmal tachycardia, unspecified: Secondary | ICD-10-CM

## 2019-01-08 ENCOUNTER — Other Ambulatory Visit: Payer: Self-pay

## 2020-03-16 ENCOUNTER — Other Ambulatory Visit: Payer: Self-pay | Admitting: Physician Assistant

## 2020-03-16 DIAGNOSIS — Z1231 Encounter for screening mammogram for malignant neoplasm of breast: Secondary | ICD-10-CM

## 2020-04-10 ENCOUNTER — Other Ambulatory Visit: Payer: Self-pay

## 2020-04-10 ENCOUNTER — Ambulatory Visit
Admission: RE | Admit: 2020-04-10 | Discharge: 2020-04-10 | Disposition: A | Discharge status: Home / Self Care | Payer: 59 | Source: Ambulatory Visit | Attending: Physician Assistant | Admitting: Physician Assistant

## 2020-04-10 DIAGNOSIS — Z1231 Encounter for screening mammogram for malignant neoplasm of breast: Secondary | ICD-10-CM | POA: Insufficient documentation

## 2021-03-01 ENCOUNTER — Other Ambulatory Visit: Payer: Self-pay | Admitting: Physician Assistant

## 2021-03-01 DIAGNOSIS — Z1231 Encounter for screening mammogram for malignant neoplasm of breast: Secondary | ICD-10-CM

## 2021-04-26 ENCOUNTER — Other Ambulatory Visit: Payer: Self-pay | Admitting: Physician Assistant

## 2021-04-26 DIAGNOSIS — Z1231 Encounter for screening mammogram for malignant neoplasm of breast: Secondary | ICD-10-CM

## 2021-04-27 ENCOUNTER — Other Ambulatory Visit: Payer: Self-pay

## 2021-04-27 ENCOUNTER — Ambulatory Visit
Admission: RE | Admit: 2021-04-27 | Discharge: 2021-04-27 | Disposition: A | Discharge status: Home / Self Care | Payer: 59 | Source: Ambulatory Visit | Attending: Physician Assistant | Admitting: Physician Assistant

## 2021-04-27 ENCOUNTER — Other Ambulatory Visit: Payer: Self-pay | Admitting: Physician Assistant

## 2021-04-27 DIAGNOSIS — Z1231 Encounter for screening mammogram for malignant neoplasm of breast: Secondary | ICD-10-CM | POA: Insufficient documentation

## 2021-05-08 ENCOUNTER — Other Ambulatory Visit: Payer: Self-pay | Admitting: Physician Assistant

## 2021-05-08 DIAGNOSIS — R921 Mammographic calcification found on diagnostic imaging of breast: Secondary | ICD-10-CM

## 2021-05-08 DIAGNOSIS — R928 Other abnormal and inconclusive findings on diagnostic imaging of breast: Secondary | ICD-10-CM

## 2022-08-30 NOTE — Progress Notes (Signed)
BP 128/74   Pulse 70   Temp 98.1 F (36.7 C) (Oral)   Resp 16   Ht 5\' 1"  (1.549 m)   Wt 172 lb (78 kg)   SpO2 99%   BMI 32.50 kg/m    Subjective:    Patient ID: Felicia Sherman, female    DOB: 1960/03/14, 62 y.o.   MRN: [id number]  HPI: Felicia Sherman is a 62 y.o. female  Chief Complaint  Patient presents with   Establish Care   Diabetes   Gastroesophageal Reflux   Hyperlipidemia   Hypertension   Establish care: her last physical was January this year.  Medical history includes DM, HTN, HLD, GERD.  Family history includes DM, prostate cancer.  Health maintenance due for labs, colon cancer screening .   Hypertension:  -Medications: lisinopril 5 mg daily, clonidine 0.1 mg daily.   -Patient is compliant with above medications and reports dizziness with clonidine.  We will increase lisinopril 10 mg daily and discontinue clonidine.   -Checking BP at home (average): 120s -Denies any SOB, CP, vision changes, LE edema or symptoms of hypotension -Diet: recommend dash diet -Exercise: increase physical activity as tolerated   Diabetes, Type 2:  -Last A1c unknown, will get labs today -Medications: levemir 40 units in the am,  novolog sliding scale sometimes,  and metformin 1000 mg daily.  Provided patient with sample of lantus while we get her assistance to afford her insulin -Patient is not compliant with the above medications and reports no side effects. She has had an issue getting her prescriptions due to cost -Checking BG at home: yes -Highest home BG since last visit: 400 s -Lowest home BG since last visit: 97 -Diet: recommend decreasing sugar and processed foods in your diet -Exercise: she has a physical job -Eye exam: utd -Foot exam: due -Microalbumin: due -Statin: yes -PNA vaccine: no -Denies symptoms of hypoglycemia, polyuria, polydipsia, numbness extremities, foot ulcers/trauma.    HLD:  -Medications: atorvastatin 80 mg daily -Patient is compliant with  above medications and reports no side effects.  -Last lipid panel:  Lipid Panel  Getting labs today  GERD/history of pancreatitis:  She says her last pancreatitis was back in 08/25/2021 GERD control status: controlledSatisfied with current treatment? yes Heartburn frequency: none while on medication Medication side effects: no  Medication compliance: stable Dysphagia: no Odynophagia:  no Hematemesis: no Blood in stool: no EGD: no   Joint swelling: she reports that she has joint swelling for about 2 years. She says that it comes and goes.  She has it in her hands, shoulder knees, hips.    Abnormal mammogram: she had a mammogram done 04/27/2021 which recommended she have diagnostic mammogram and ultrasound of bilateral breasts. She reports that she could not afford it.  Will provide information to get assistance for additional imaging.  New orders placed.   Obesity:  Current weight : 172 lbs BMI: 32.50 Treatment Tried: life style modification Comorbidities: DM, HTN, HLD      09/02/2022   10:56 AM  Depression screen PHQ 2/9  Decreased Interest 0  Down, Depressed, Hopeless 0  PHQ - 2 Score 0    Relevant past medical, surgical, family and social history reviewed and updated as indicated. Interim medical history since our last visit reviewed. Allergies and medications reviewed and updated.  Review of Systems   Constitutional: Negative for fever or weight change.  Respiratory: Negative for cough and shortness of breath.   Cardiovascular: Negative for chest pain  or palpitations.  Gastrointestinal: Negative for abdominal pain, no bowel changes.  Musculoskeletal: Negative for gait problem or joint swelling.  Skin: Negative for rash.  Neurological: Negative for dizziness or headache.  No other specific complaints in a complete review of systems (except as listed in HPI above).     Objective:    BP 128/74   Pulse 70   Temp 98.1 F (36.7 C) (Oral)   Resp 16   Ht 5\' 1"  (1.549  m)   Wt 172 lb (78 kg)   SpO2 99%   BMI 32.50 kg/m   Wt Readings from Last 3 Encounters:  09/02/22 172 lb (78 kg)  10/28/18 193 lb (87.5 kg)  08/23/16 225 lb (102.1 kg)    Physical Exam  Constitutional: Patient appears well-developed and well-nourished. Obese  No distress.  HEENT: head atraumatic, normocephalic, pupils equal and reactive to light, neck supple, throat within normal limits Cardiovascular: Normal rate, regular rhythm and normal heart sounds.  No murmur heard. No BLE edema. Pulmonary/Chest: Effort normal and breath sounds normal. No respiratory distress. Abdominal: Soft.  There is no tenderness. Psychiatric: Patient has a normal mood and affect. behavior is normal. Judgment and thought content normal.   Results for orders placed or performed during the hospital encounter of 08/23/16  Lipase, blood  Result Value Ref Range   Lipase 39 11 - 51 U/L  Comprehensive metabolic panel  Result Value Ref Range   Sodium 138 135 - 145 mmol/L   Potassium 4.4 3.5 - 5.1 mmol/L   Chloride 104 101 - 111 mmol/L   CO2 25 22 - 32 mmol/L   Glucose, Bld 93 65 - 99 mg/dL   BUN 17 6 - 20 mg/dL   Creatinine, Ser 2.84 (H) 0.44 - 1.00 mg/dL   Calcium 13.2 8.9 - 44.0 mg/dL   Total Protein 8.1 6.5 - 8.1 g/dL   Albumin 4.3 3.5 - 5.0 g/dL   AST 28 15 - 41 U/L   ALT 18 14 - 54 U/L   Alkaline Phosphatase 73 38 - 126 U/L   Total Bilirubin 0.9 0.3 - 1.2 mg/dL   GFR calc non Af Amer 55 (L) >60 mL/min   GFR calc Af Amer >60 >60 mL/min   Anion gap 9 5 - 15  CBC  Result Value Ref Range   WBC 8.9 3.6 - 11.0 K/uL   RBC 4.87 3.80 - 5.20 MIL/uL   Hemoglobin 14.3 12.0 - 16.0 g/dL   HCT 10.2 72.5 - 36.6 %   MCV 87.1 80.0 - 100.0 fL   MCH 29.3 26.0 - 34.0 pg   MCHC 33.6 32.0 - 36.0 g/dL   RDW 44.0 34.7 - 42.5 %   Platelets 272 150 - 440 K/uL  Urinalysis, Complete w Microscopic  Result Value Ref Range   Color, Urine AMBER (A) YELLOW   APPearance CLOUDY (A) CLEAR   Specific Gravity, Urine 1.024  1.005 - 1.030   pH 5.0 5.0 - 8.0   Glucose, UA NEGATIVE NEGATIVE mg/dL   Hgb urine dipstick NEGATIVE NEGATIVE   Bilirubin Urine NEGATIVE NEGATIVE   Ketones, ur 5 (A) NEGATIVE mg/dL   Protein, ur 956 (A) NEGATIVE mg/dL   Nitrite NEGATIVE NEGATIVE   Leukocytes, UA NEGATIVE NEGATIVE   RBC / HPF 0-5 0 - 5 RBC/hpf   WBC, UA 0-5 0 - 5 WBC/hpf   Bacteria, UA NONE SEEN NONE SEEN   Squamous Epithelial / HPF 6-30 (A) NONE SEEN   Mucus PRESENT  Hyaline Casts, UA PRESENT   Troponin I  Result Value Ref Range   Troponin I <0.03 <0.03 ng/mL      Assessment & Plan:   Problem List Items Addressed This Visit       Cardiovascular and Mediastinum   Hypertension    Clonidine was causing dizziness, stopped clonidine, increase lisinopril to 10 mg daily,  try and eat a DASH diet      Relevant Medications   lisinopril (ZESTRIL) 10 MG tablet   atorvastatin (LIPITOR) 80 MG tablet   Other Relevant Orders   AMB Referral to Managed Medicaid Care Management   CBC with Differential/Platelet   COMPLETE METABOLIC PANEL WITH GFR     Digestive   Gastroesophageal reflux disease without esophagitis    Continue pantoprazole 40 mg daily.  Avoid triggers      Relevant Medications   ondansetron (ZOFRAN) 4 MG tablet   pantoprazole (PROTONIX) 40 MG tablet   Other Relevant Orders   AMB Referral to Managed Medicaid Care Management     Endocrine   Type 2 diabetes mellitus with hyperglycemia, with long-term current use of insulin (HCC)    Has been having trouble getting her medication.  She says her insulin is too expensive.  Put a referral in for coordination of care to assist with cost.  Also provide patient a sample of Lantus from the office.  Patient was taking Levemir 40 units in the a.m. using NovoLog sliding scale and metformin 1000 mg daily.  Patient states she has been out of her medication except for the NovoLog.  Getting labs today.      Relevant Medications   lisinopril (ZESTRIL) 10 MG tablet    insulin detemir (LEVEMIR FLEXPEN) 100 UNIT/ML FlexPen   metFORMIN (GLUCOPHAGE) 1000 MG tablet   atorvastatin (LIPITOR) 80 MG tablet   Insulin Pen Needle 32G X 6 MM MISC   Other Relevant Orders   AMB Referral to Managed Medicaid Care Management   Microalbumin / creatinine urine ratio   COMPLETE METABOLIC PANEL WITH GFR   Hemoglobin A1c     Other   Mixed hyperlipidemia    Patient continues to take atorvastatin 80 mg daily.      Relevant Medications   lisinopril (ZESTRIL) 10 MG tablet   atorvastatin (LIPITOR) 80 MG tablet   Other Relevant Orders   AMB Referral to Managed Medicaid Care Management   COMPLETE METABOLIC PANEL WITH GFR   Lipid panel   Class 1 obesity due to excess calories with serious comorbidity and body mass index (BMI) of 32.0 to 32.9 in adult    Recommend patient increase physical activity and eat a well-balanced diet with portion control.      Relevant Medications   insulin detemir (LEVEMIR FLEXPEN) 100 UNIT/ML FlexPen   metFORMIN (GLUCOPHAGE) 1000 MG tablet   History of pancreatitis    Has not had an episode of pancreatitis since last year.      Relevant Orders   Lipase   Other Visit Diagnoses     Pain in other joint    -  Primary   getting labs   Relevant Orders   C-reactive protein   Sedimentation rate   ANA   Rheumatoid Factor   Encounter for hepatitis C screening test for low risk patient       Relevant Orders   Hepatitis C antibody   Screening for HIV without presence of risk factors       Relevant Orders   HIV Antibody (routine  testing w rflx)   Colon cancer screening       Relevant Orders   Cologuard   Abnormal mammogram of both breasts       new orders placed for diagnostic testing and information provided for assistance   Relevant Orders   MM 3D DIAGNOSTIC MAMMOGRAM BILATERAL BREAST   US LIMITED ULTRASOUND INCLUDING AXILLA LEFT BREAST    [place] LIMITED ULTRASOUND INCLUDING AXILLA RIGHT BREAST   Encounter to establish care        schedule cpe        Follow up plan: Return in about 4 months (around 01/03/2023) for follow up.

## 2022-09-02 ENCOUNTER — Ambulatory Visit (INDEPENDENT_AMBULATORY_CARE_PROVIDER_SITE_OTHER): Payer: 59 | Admitting: Nurse Practitioner

## 2022-09-02 ENCOUNTER — Other Ambulatory Visit: Payer: Self-pay

## 2022-09-02 ENCOUNTER — Encounter: Payer: Self-pay | Admitting: Nurse Practitioner

## 2022-09-02 VITALS — BP 128/74 | HR 70 | Temp 98.1°F | Resp 16 | Ht 61.0 in | Wt 172.0 lb

## 2022-09-02 DIAGNOSIS — E1165 Type 2 diabetes mellitus with hyperglycemia: Secondary | ICD-10-CM | POA: Diagnosis not present

## 2022-09-02 DIAGNOSIS — Z7689 Persons encountering health services in other specified circumstances: Secondary | ICD-10-CM

## 2022-09-02 DIAGNOSIS — E782 Mixed hyperlipidemia: Secondary | ICD-10-CM

## 2022-09-02 DIAGNOSIS — M2559 Pain in other specified joint: Secondary | ICD-10-CM | POA: Diagnosis not present

## 2022-09-02 DIAGNOSIS — E6609 Other obesity due to excess calories: Secondary | ICD-10-CM | POA: Insufficient documentation

## 2022-09-02 DIAGNOSIS — Z8719 Personal history of other diseases of the digestive system: Secondary | ICD-10-CM

## 2022-09-02 DIAGNOSIS — I10 Essential (primary) hypertension: Secondary | ICD-10-CM

## 2022-09-02 DIAGNOSIS — Z794 Long term (current) use of insulin: Secondary | ICD-10-CM

## 2022-09-02 DIAGNOSIS — R928 Other abnormal and inconclusive findings on diagnostic imaging of breast: Secondary | ICD-10-CM

## 2022-09-02 DIAGNOSIS — Z1211 Encounter for screening for malignant neoplasm of colon: Secondary | ICD-10-CM

## 2022-09-02 DIAGNOSIS — Z6832 Body mass index (BMI) 32.0-32.9, adult: Secondary | ICD-10-CM

## 2022-09-02 DIAGNOSIS — K219 Gastro-esophageal reflux disease without esophagitis: Secondary | ICD-10-CM | POA: Insufficient documentation

## 2022-09-02 DIAGNOSIS — Z114 Encounter for screening for human immunodeficiency virus [HIV]: Secondary | ICD-10-CM

## 2022-09-02 DIAGNOSIS — Z1159 Encounter for screening for other viral diseases: Secondary | ICD-10-CM

## 2022-09-02 LAB — CBC WITH DIFFERENTIAL/PLATELET
Absolute Monocytes: 498 cells/uL (ref 200–950)
Basophils Relative: 0.5 %
Eosinophils Relative: 2.9 %
Lymphs Abs: 4124 cells/uL — ABNORMAL HIGH (ref 850–3900)
MCHC: 32.3 g/dL (ref 32.0–36.0)
Neutro Abs: 3010 cells/uL (ref 1500–7800)
Neutrophils Relative %: 38.1 %

## 2022-09-02 MED ORDER — LEVEMIR FLEXPEN 100 UNIT/ML ~~LOC~~ SOPN
40.0000 [IU] | PEN_INJECTOR | Freq: Every day | SUBCUTANEOUS | 3 refills | Status: DC
Start: 2022-09-02 — End: 2022-09-03

## 2022-09-02 MED ORDER — PANTOPRAZOLE SODIUM 40 MG PO TBEC
40.0000 mg | DELAYED_RELEASE_TABLET | Freq: Every day | ORAL | 0 refills | Status: DC
Start: 2022-09-02 — End: 2022-09-03

## 2022-09-02 MED ORDER — INSULIN PEN NEEDLE 32G X 6 MM MISC
1.0000 | Freq: Every day | 3 refills | Status: DC
Start: 2022-09-02 — End: 2022-09-03

## 2022-09-02 MED ORDER — ATORVASTATIN CALCIUM 80 MG PO TABS
80.0000 mg | ORAL_TABLET | Freq: Every day | ORAL | 0 refills | Status: DC
Start: 2022-09-02 — End: 2022-09-03

## 2022-09-02 MED ORDER — LISINOPRIL 10 MG PO TABS
10.0000 mg | ORAL_TABLET | Freq: Every day | ORAL | 0 refills | Status: DC
Start: 2022-09-02 — End: 2022-09-03

## 2022-09-02 MED ORDER — METFORMIN HCL 1000 MG PO TABS
1000.0000 mg | ORAL_TABLET | Freq: Every day | ORAL | 1 refills | Status: DC
Start: 2022-09-02 — End: 2022-09-03

## 2022-09-02 NOTE — Assessment & Plan Note (Signed)
Has not had an episode of pancreatitis since last year.

## 2022-09-02 NOTE — Assessment & Plan Note (Signed)
Continue pantoprazole 40 mg daily.  Avoid triggers

## 2022-09-02 NOTE — Assessment & Plan Note (Signed)
Clonidine was causing dizziness, stopped clonidine, increase lisinopril to 10 mg daily,  try and eat a DASH diet

## 2022-09-02 NOTE — Assessment & Plan Note (Signed)
Recommend patient increase physical activity and eat a well-balanced diet with portion control.

## 2022-09-02 NOTE — Assessment & Plan Note (Signed)
Patient continues to take atorvastatin 80 mg daily.

## 2022-09-02 NOTE — Assessment & Plan Note (Signed)
Has been having trouble getting her medication.  She says her insulin is too expensive.  Put a referral in for coordination of care to assist with cost.  Also provide patient a sample of Lantus from the office.  Patient was taking Levemir 40 units in the a.m. using NovoLog sliding scale and metformin 1000 mg daily.  Patient states she has been out of her medication except for the NovoLog.  Getting labs today.

## 2022-09-03 ENCOUNTER — Other Ambulatory Visit: Payer: Self-pay | Admitting: Nurse Practitioner

## 2022-09-03 DIAGNOSIS — E1165 Type 2 diabetes mellitus with hyperglycemia: Secondary | ICD-10-CM

## 2022-09-03 DIAGNOSIS — E782 Mixed hyperlipidemia: Secondary | ICD-10-CM

## 2022-09-03 DIAGNOSIS — I10 Essential (primary) hypertension: Secondary | ICD-10-CM

## 2022-09-03 DIAGNOSIS — K219 Gastro-esophageal reflux disease without esophagitis: Secondary | ICD-10-CM

## 2022-09-03 LAB — COMPLETE METABOLIC PANEL WITH GFR
AG Ratio: 1.6 (calc) (ref 1.0–2.5)
ALT: 14 U/L (ref 6–29)
AST: 20 U/L (ref 10–35)
Albumin: 3.8 g/dL (ref 3.6–5.1)
Alkaline phosphatase (APISO): 71 U/L (ref 37–153)
BUN: 20 mg/dL (ref 7–25)
CO2: 26 mmol/L (ref 20–32)
Calcium: 9.7 mg/dL (ref 8.6–10.4)
Chloride: 112 mmol/L — ABNORMAL HIGH (ref 98–110)
Creat: 1.03 mg/dL (ref 0.50–1.05)
Globulin: 2.4 g/dL (calc) (ref 1.9–3.7)
Glucose, Bld: 53 mg/dL — ABNORMAL LOW (ref 65–99)
Potassium: 3.8 mmol/L (ref 3.5–5.3)
Sodium: 144 mmol/L (ref 135–146)
Total Bilirubin: 0.4 mg/dL (ref 0.2–1.2)
Total Protein: 6.2 g/dL (ref 6.1–8.1)
eGFR: 61 mL/min/{1.73_m2} (ref >=60)

## 2022-09-03 LAB — CBC WITH DIFFERENTIAL/PLATELET
Basophils Absolute: 40 cells/uL (ref 0–200)
Eosinophils Absolute: 229 cells/uL (ref 15–500)
HCT: 35.9 % (ref 35.0–45.0)
Hemoglobin: 11.6 g/dL — ABNORMAL LOW (ref 11.7–15.5)
MCH: 29.4 pg (ref 27.0–33.0)
MCV: 91.1 fL (ref 80.0–100.0)
MPV: 11 fL (ref 7.5–12.5)
Monocytes Relative: 6.3 %
Platelets: 256 10*3/uL (ref 140–400)
RBC: 3.94 10*6/uL (ref 3.80–5.10)
RDW: 12.6 % (ref 11.0–15.0)
Total Lymphocyte: 52.2 %
WBC: 7.9 10*3/uL (ref 3.8–10.8)

## 2022-09-03 LAB — MICROALBUMIN / CREATININE URINE RATIO
Creatinine, Urine: 191 mg/dL (ref 20–275)
Microalb Creat Ratio: 51 mg/g creat — ABNORMAL HIGH (ref <30)
Microalb, Ur: 9.8 mg/dL

## 2022-09-03 LAB — HEMOGLOBIN A1C
Hgb A1c MFr Bld: 9.2 % of total Hgb — ABNORMAL HIGH (ref <5.7)
Mean Plasma Glucose: 217 mg/dL
eAG (mmol/L): 12 mmol/L

## 2022-09-03 LAB — LIPID PANEL
Cholesterol: 162 mg/dL (ref <200)
HDL: 64 mg/dL (ref >=50)
LDL Cholesterol (Calc): 83 mg/dL (calc)
Non-HDL Cholesterol (Calc): 98 mg/dL (calc) (ref <130)
Total CHOL/HDL Ratio: 2.5 (calc) (ref <5.0)
Triglycerides: 73 mg/dL (ref <150)

## 2022-09-03 LAB — LIPASE: Lipase: 16 U/L (ref 7–60)

## 2022-09-03 LAB — ANA: Anti Nuclear Antibody (ANA): NEGATIVE

## 2022-09-03 LAB — RHEUMATOID FACTOR: Rheumatoid fact SerPl-aCnc: <10 IU/mL (ref <14)

## 2022-09-03 LAB — HIV ANTIBODY (ROUTINE TESTING W REFLEX): HIV 1&2 Ab, 4th Generation: NONREACTIVE

## 2022-09-03 LAB — SEDIMENTATION RATE: Sed Rate: 62 mm/h — ABNORMAL HIGH (ref 0–30)

## 2022-09-03 LAB — HEPATITIS C ANTIBODY: Hepatitis C Ab: NONREACTIVE

## 2022-09-03 LAB — C-REACTIVE PROTEIN: CRP: 7.5 mg/L (ref <8.0)

## 2022-09-03 MED ORDER — INSULIN PEN NEEDLE 32G X 6 MM MISC
1.0000 | Freq: Every day | 3 refills | Status: DC
Start: 2022-09-03 — End: 2022-09-04

## 2022-09-03 MED ORDER — METFORMIN HCL 1000 MG PO TABS: 1000.0000 mg | ORAL_TABLET | Freq: Every day | ORAL | 1 refills | Status: DC

## 2022-09-03 MED ORDER — PANTOPRAZOLE SODIUM 40 MG PO TBEC: 40.0000 mg | DELAYED_RELEASE_TABLET | Freq: Every day | ORAL | 3 refills | Status: DC

## 2022-09-03 MED ORDER — LEVEMIR FLEXPEN 100 UNIT/ML ~~LOC~~ SOPN: 40.0000 [IU] | PEN_INJECTOR | Freq: Every day | SUBCUTANEOUS | 3 refills | Status: DC

## 2022-09-03 MED ORDER — ATORVASTATIN CALCIUM 80 MG PO TABS
80.0000 mg | ORAL_TABLET | Freq: Every day | ORAL | 3 refills | Status: DC
Start: 2022-09-03 — End: 2023-10-02

## 2022-09-03 MED ORDER — LISINOPRIL 10 MG PO TABS: 10.0000 mg | ORAL_TABLET | Freq: Every day | ORAL | 1 refills | Status: DC

## 2022-09-03 NOTE — Telephone Encounter (Signed)
Pt called stating that she needs her medications that were called in during her visit yesterday sent to [facility] [address] in [place] instead of [facility] where they are currently.   insulin detemir (LEVEMIR FLEXPEN) 100 UNIT/ML FlexPen  atorvastatin (LIPITOR) 80 MG tablet  Insulin Pen Needle 32G X 6 MM MISC  lisinopril (ZESTRIL) 10 MG tablet  metFORMIN (GLUCOPHAGE) 1000 MG table  pantoprazole (PROTONIX) 40 MG tablet   [id number] - [place] [place] - [address]  [address] [place] [address]  Phone: [phone] Fax: [phone]  Hours: Not open 24 hours

## 2022-09-03 NOTE — Telephone Encounter (Signed)
Requested Prescriptions  Pending Prescriptions Disp Refills   insulin detemir (LEVEMIR FLEXPEN) 100 UNIT/ML FlexPen 15 mL 3    Sig: Inject 40 Units into the skin daily.     Endocrinology:  Diabetes - Insulins Failed - 09/03/2022  4:14 PM      Failed - HBA1C is between 0 and 7.9 and within 180 days    Hgb A1c MFr Bld  Date Value Ref Range Status  09/02/2022 9.2 (H) <5.7 % of total Hgb Final    Comment:    For someone without known diabetes, a hemoglobin A1c value of 6.5% or greater indicates that they may have  diabetes and this should be confirmed with a follow-up  test. . For someone with known diabetes, a value <7% indicates  that their diabetes is well controlled and a value  greater than or equal to 7% indicates suboptimal  control. A1c targets should be individualized based on  duration of diabetes, age, comorbid conditions, and  other considerations. . Currently, no consensus exists regarding use of hemoglobin A1c for diagnosis of diabetes for children. Verna Czech - Valid encounter within last 6 months    Recent Outpatient Visits           Yesterday [facility] FNP       Future Appointments             In 4 months Zane Herald, Rudolpho Sevin, FNP [facility]             atorvastatin (LIPITOR) 80 MG tablet 90 tablet 3    Sig: Take 1 tablet (80 mg total) by mouth daily.     Cardiovascular:  Antilipid - Statins Failed - 09/03/2022  4:14 PM      Failed - Lipid Panel in normal range within the last 12 months    Cholesterol  Date Value Ref Range Status  09/02/2022 162 <200 mg/dL Final   LDL Cholesterol (Calc)  Date Value Ref Range Status  09/02/2022 83 mg/dL (calc) Final    Comment:    Reference range: <100 . Desirable range <100 mg/dL for primary prevention;   <70 mg/dL for patients with CHD or diabetic patients  with > or = 2 CHD risk factors. Marland Kitchen LDL-C is  now calculated using the Martin-Hopkins  calculation, which is a validated novel method providing  better accuracy than the Friedewald equation in the  estimation of LDL-C.  Horald Pollen et al. Lenox Ahr. 1324;401(02): 2061-2068  (http://education.[employer].com/faq/FAQ164)    HDL  Date Value Ref Range Status  09/02/2022 64 > OR = 50 mg/dL Final   Triglycerides  Date Value Ref Range Status  09/02/2022 73 <150 mg/dL Final         Passed - Patient is not pregnant      Passed - Valid encounter within last 12 months    Recent Outpatient Visits           Yesterday Pain in other joint   [facility] Berniece Salines, FNP       Future Appointments             In 4 months Zane Herald, Rudolpho Sevin, FNP [facility]             Insulin Pen Needle 32G X 6 MM MISC 100 each 3    Sig:  1 each by Does not apply route daily.     Endocrinology: Diabetes - Testing Supplies Passed - 09/03/2022  4:14 PM      Passed - Valid encounter within last 12 months    Recent Outpatient Visits           Yesterday Pain in other joint   [facility] Berniece Salines, FNP       Future Appointments             In 4 months Zane Herald, Rudolpho Sevin, FNP [facility]             lisinopril (ZESTRIL) 10 MG tablet 90 tablet 1    Sig: Take 1 tablet (10 mg total) by mouth daily.     Cardiovascular:  ACE Inhibitors Passed - 09/03/2022  4:14 PM      Passed - Cr in normal range and within 180 days    Creat  Date Value Ref Range Status  09/02/2022 1.03 0.50 - 1.05 mg/dL Final   Creatinine, Urine  Date Value Ref Range Status  09/02/2022 191 20 - 275 mg/dL Final         Passed - K in normal range and within 180 days    Potassium  Date Value Ref Range Status  09/02/2022 3.8 3.5 - 5.3 mmol/L Final         Passed - Patient is not pregnant      Passed - Last BP in normal range    BP Readings  from Last 1 Encounters:  09/02/22 128/74         Passed - Valid encounter within last 6 months    Recent Outpatient Visits           Yesterday Pain in other joint   [facility] Berniece Salines, FNP       Future Appointments             In 4 months Zane Herald, Rudolpho Sevin, FNP [facility]             metFORMIN (GLUCOPHAGE) 1000 MG tablet 90 tablet 1    Sig: Take 1 tablet (1,000 mg total) by mouth daily with breakfast.     Endocrinology:  Diabetes - Biguanides Failed - 09/03/2022  4:14 PM      Failed - HBA1C is between 0 and 7.9 and within 180 days    Hgb A1c MFr Bld  Date Value Ref Range Status  09/02/2022 9.2 (H) <5.7 % of total Hgb Final    Comment:    For someone without known diabetes, a hemoglobin A1c value of 6.5% or greater indicates that they may have  diabetes and this should be confirmed with a follow-up  test. . For someone with known diabetes, a value <7% indicates  that their diabetes is well controlled and a value  greater than or equal to 7% indicates suboptimal  control. A1c targets should be individualized based on  duration of diabetes, age, comorbid conditions, and  other considerations. . Currently, no consensus exists regarding use of hemoglobin A1c for diagnosis of diabetes for children. .          Failed - B12 Level in normal range and within 720 days    No results found for: "VITAMINB12"       Failed - CBC within normal limits and completed in the last 12 months    WBC  Date Value Ref Range Status  09/02/2022 7.9 3.8 - 10.8 Thousand/uL Final   RBC  Date Value Ref Range Status  09/02/2022 3.94 3.80 - 5.10 Million/uL Final   Hemoglobin  Date Value Ref Range Status  09/02/2022 11.6 (L) 11.7 - 15.5 g/dL Final   HCT  Date Value Ref Range Status  09/02/2022 35.9 35.0 - 45.0 % Final   MCHC  Date Value Ref Range Status  09/02/2022 32.3 32.0 - 36.0 g/dL Final   [facility]  Date Value  Ref Range Status  09/02/2022 29.4 27.0 - 33.0 pg Final   MCV  Date Value Ref Range Status  09/02/2022 91.1 80.0 - 100.0 fL Final   No results found for: "PLTCOUNTKUC", "LABPLAT", "POCPLA" RDW  Date Value Ref Range Status  09/02/2022 12.6 11.0 - 15.0 % Final         Passed - Cr in normal range and within 360 days    Creat  Date Value Ref Range Status  09/02/2022 1.03 0.50 - 1.05 mg/dL Final   Creatinine, Urine  Date Value Ref Range Status  09/02/2022 191 20 - 275 mg/dL Final         Passed - eGFR in normal range and within 360 days    GFR calc Af Amer  Date Value Ref Range Status  08/23/2016 >60 >60 mL/min Final    Comment:    (NOTE) The eGFR has been calculated using the CKD EPI equation. This calculation has not been validated in all clinical situations. eGFR's persistently <60 mL/min signify possible Chronic Kidney Disease.    GFR calc non Af Amer  Date Value Ref Range Status  08/23/2016 55 (L) >60 mL/min Final   eGFR  Date Value Ref Range Status  09/02/2022 61 > OR = 60 mL/min/1.77m2 Final         Passed - Valid encounter within last 6 months    Recent Outpatient Visits           Yesterday Pain in other joint   [facility] Berniece Salines, FNP       Future Appointments             In 4 months Zane Herald, Rudolpho Sevin, FNP [facility]             pantoprazole (PROTONIX) 40 MG tablet 90 tablet 3    Sig: Take 1 tablet (40 mg total) by mouth daily.     Gastroenterology: Proton Pump Inhibitors Passed - 09/03/2022  4:14 PM      Passed - Valid encounter within last 12 months    Recent Outpatient Visits           Yesterday Pain in other joint   [facility] Berniece Salines, FNP       Future Appointments             In 4 months Zane Herald, Rudolpho Sevin, FNP [facility]

## 2022-09-04 ENCOUNTER — Other Ambulatory Visit: Payer: Self-pay | Admitting: Nurse Practitioner

## 2022-09-04 ENCOUNTER — Other Ambulatory Visit: Payer: Self-pay

## 2022-09-04 ENCOUNTER — Other Ambulatory Visit: Payer: Self-pay | Admitting: Pharmacist

## 2022-09-04 DIAGNOSIS — E1165 Type 2 diabetes mellitus with hyperglycemia: Secondary | ICD-10-CM

## 2022-09-04 MED ORDER — INSULIN PEN NEEDLE 32G X 6 MM MISC
1.0000 | Freq: Every day | 3 refills | Status: AC
Start: 2022-09-04 — End: ?
  Filled 2022-09-04: qty 100, 90d supply, fill #0

## 2022-09-04 MED ORDER — TRESIBA FLEXTOUCH 100 UNIT/ML ~~LOC~~ SOPN
32.0000 [IU] | PEN_INJECTOR | Freq: Every day | SUBCUTANEOUS | 1 refills | Status: DC
Start: 2022-09-04 — End: 2022-09-04
  Filled 2022-09-04: qty 30, 90d supply, fill #0

## 2022-09-04 MED ORDER — ACCU-CHEK SOFTCLIX LANCETS MISC
3 refills | Status: AC
Start: 2022-09-04 — End: ?
  Filled 2022-09-04: qty 100, 25d supply, fill #0

## 2022-09-04 MED ORDER — TRESIBA FLEXTOUCH 100 UNIT/ML ~~LOC~~ SOPN
32.0000 [IU] | PEN_INJECTOR | Freq: Every day | SUBCUTANEOUS | 1 refills | Status: DC
  Filled 2022-09-04: qty 30, 93d supply, fill #0
  Filled 2023-02-03: qty 30, 93d supply, fill #1

## 2022-09-04 MED ORDER — ACCU-CHEK GUIDE VI STRP
ORAL_STRIP | 3 refills | Status: DC
Start: 2022-09-04 — End: 2023-02-03
  Filled 2022-09-04: qty 350, 87d supply, fill #0

## 2022-09-04 MED ORDER — BASAGLAR KWIKPEN 100 UNIT/ML ~~LOC~~ SOPN
32.0000 [IU] | PEN_INJECTOR | Freq: Every day | SUBCUTANEOUS | 1 refills | Status: DC
  Filled 2022-09-04 (×2): qty 15, 46d supply, fill #0

## 2022-09-04 MED ORDER — ACCU-CHEK GUIDE W/DEVICE KIT
PACK | 0 refills | Status: AC
Start: 2022-09-04 — End: ?
  Filled 2022-09-04 – 2022-09-12 (×2): qty 1, 30d supply, fill #0

## 2022-09-04 NOTE — Progress Notes (Signed)
09/04/2022 Name: Felicia Sherman MRN: [id number] DOB: 05/07/1960  Chief Complaint  Patient presents with   Medication Assistance    Felicia Sherman is a 62 y.o. year old female who presented for a telephone visit.   They were referred to the pharmacist by their PCP for assistance in managing medication access.   Unable to complete full medication review today as patient not currently home  Subjective:  Care Team: Primary Care Provider: Berniece Salines, FNP ; Next Scheduled Visit: 01/03/2023  Medication Access/Adherence  Current Pharmacy:  [facility] - [place], [place] - [address] [place] [address] Phone: [phone] Fax: [phone]  [id number] [place], [place] - [address] AT [facility] OF [address] [place] [address] Phone: [phone] Fax: [phone]  [id number] [place], [place] - [address] [place] [address] Phone: [phone] Fax: [phone]   Patient reports affordability concerns with their medications: Yes  Patient reports access/transportation concerns to their pharmacy: No  Patient reports adherence concerns with their medications:  Yes  due to cost  Today patient reports cost of her Levemir insulin and pen needles have been unaffordable to her. Reports [facility] was >$60  Diabetes:  Current medications:  - metformin 1000 mg daily with breakfast - Reports previously taking Levemir Flexpen 40 units daily, but has been out of this for ~2 months (cost) - Novolog insulin - reports takes three times daily with meals according to a previous sliding scale from [facility] for pre-meal blood sugar readings >200  Medications tried in the past: Levemir (cost/discontinued by [employer])  Current glucose readings: recalls blood sugar reading this morning before breakfast: 131  Reports recently  has not been testing blood sugar as consistently due to cost of test strips (purchasing over the counter)  Patient denies hypoglycemic s/sx including dizziness, shakiness, sweating.  - Patient reports that she carries soft peppermints with her to use if needed to treat low blood sugar   Objective:  Lab Results  Component Value Date   HGBA1C 9.2 (H) 09/02/2022    Lab Results  Component Value Date   CREATININE 1.03 09/02/2022   BUN 20 09/02/2022   NA 144 09/02/2022   K 3.8 09/02/2022   CL 112 (H) 09/02/2022   CO2 26 09/02/2022    Lab Results  Component Value Date   CHOL 162 09/02/2022   HDL 64 09/02/2022   LDLCALC 83 09/02/2022   TRIG 73 09/02/2022   CHOLHDL 2.5 09/02/2022    Medications Reviewed Today     Reviewed by Riesa Pope, RMA (Registered Medical Assistant) on 09/02/22 at [phone]  Med List Status: <None>   Medication Order Taking? Sig Documenting Provider Last Dose Status Informant  albuterol (VENTOLIN HFA) 108 (90 Base) MCG/ACT inhaler [id number] Yes Inhale 2 puffs into the lungs every 6 (six) hours as needed for wheezing or shortness of breath. [provider] Taking Active   aspirin 81 MG tablet [id number] Yes Take 81 mg by mouth daily. [provider] Taking Active Self  atorvastatin (LIPITOR) 80 MG tablet [id number] Yes Take 80 mg by mouth daily. [provider] Taking Active   bimatoprost (LUMIGAN) 0.01 % SOLN [id number] Yes Place 1 drop into both eyes. [provider]  Active   cloNIDine (CATAPRES) 0.1 MG tablet [id number] Yes Take 0.1 mg  by mouth daily. [provider] Taking Active Self  dorzolamide-timolol (COSOPT) 2-0.5 % ophthalmic solution [id number] Yes Place 1 drop into both eyes. [provider]  Active   fluticasone (FLONASE) 50 MCG/ACT nasal spray [id number] Yes Place into both nostrils daily. [provider] Taking Active   insulin aspart (NOVOLOG) 100 UNIT/ML injection [id number] Yes Inject  10-16 Units into the skin 3 (three) times daily before meals. [provider] Taking Active Self           Med Note Deretha Emory, [facility]   Fri Aug 23, 2016 12:24 PM) Sliding scale  insulin detemir (LEVEMIR) 100 UNIT/ML injection [id number] No Inject 45 Units into the skin at bedtime.   Patient not taking: Reported on 09/02/2022   [provider] Not Taking Active Self  lisinopril (PRINIVIL,ZESTRIL) 5 MG tablet [id number] Yes Take 5 mg by mouth daily. [provider] Taking Active Self  metFORMIN (GLUCOPHAGE) 1000 MG tablet [id number] Yes Take 1,000 mg by mouth 2 (two) times daily with a meal. [provider] Taking Active Self  Multiple Vitamin (MULTI-VITAMIN) tablet [id number] Yes Take 1 tablet by mouth daily. [provider] Taking Active   ondansetron (ZOFRAN ODT) 4 MG disintegrating tablet [id number] Yes Take 1 tablet (4 mg total) by mouth every 8 (eight) hours as needed for nausea or vomiting. Sharman Cheek, MD Taking Active            Med Note (JEFFRIES, Cottie Banda Sep 02, 2022  9:04 AM) prn  ondansetron (ZOFRAN) 4 MG tablet [id number] Yes Take 4 mg by mouth every 8 (eight) hours as needed for nausea or vomiting. [provider] Taking Active   pantoprazole (PROTONIX) 40 MG tablet [id number] Yes Take 40 mg by mouth daily. [provider] Taking Active               Assessment/Plan:   Unable to complete full medication review today as patient not currently home; will plan to review during next appointment  Diabetes: - Currently uncontrolled - Reviewed goal A1c, goal fasting, and goal 2 hour post prandial glucose - Reviewed dietary modifications including importance of having regular well-balanced meals while controlling carbohydrate portion sizes. Advise patient against skipping meals - Collaborate with PCP to let provider know Levemir Flexpen has been discontinued by the manufacturer  Per pharmacy formulary document  from [facility] plan website, Evaristo Bury and Hospital doctor are formulary alternatives covered through patient's plan Recommend with change from Levemir to [place] provider consider reducing total insulin dose by 10-20% to start to reduce risk of hypoglycemia Collaborate with PCP and Lauralee Evener at [facility] for cost savings options for patient's long-acting insulin.  With manufacturer savings card, patient's cost for Evaristo Bury reduced to $99 for 90 day supply  Pharmacy will order Evaristo Bury for patient to pick up tomorrow afternoon  Follow up with patient to provide counseling on switch of Levemir to [place] 32 units daily  Provide patient with phone number for [facility] - Patient's cost for pen needle prescription ~17.59 through [facility] - Per pharmacy formulary document from [facility] health plan website, Accu Chek glucometer/blood sugar testing supplies preferred Per protocol, send prescriptions for Accu Chek Guide meter and testing supplies to [facility]  - Counsel patient on s/s of low blood sugar and how to treat lows Review rules of 15s - importance of using 15 grams of sugar to treat low, recheck blood sugar in 15 minutes, treat again if remains  low or, if back to normal, having meal if mealtime or snack  Review examples of sources of 15 grams of sugar Patient reports that she carries soft peppermints with her to use if needed to treat low blood sugar - Recommend to check glucose, keep log of results and have this record to review during future appointments. Patient to contact office sooner if needed for readings outside of established parameters or if having symptoms, particularly if has any symptoms of hypoglycemia   Follow Up Plan: Clinical Pharmacist will follow up with patient by telephone on 10/02/2022 at 11:30 am  Estelle Grumbles, PharmD, [facility] [phone]

## 2022-09-04 NOTE — Patient Instructions (Signed)
Goals Addressed             This Visit's Progress    Pharmacy Goals       Our goal A1c is less than 7%. This corresponds with fasting sugars between 80-130 and 2 hour after meal sugars less than 180. Please keep a log of your results when checking your blood sugar   Our goal bad cholesterol, or LDL, is less than 70. This is why it is important to continue taking your atorvastatin.  Estelle Grumbles, PharmD, [facility] [phone]

## 2022-09-05 ENCOUNTER — Other Ambulatory Visit: Payer: Self-pay

## 2022-09-06 ENCOUNTER — Other Ambulatory Visit: Payer: Self-pay

## 2022-09-10 ENCOUNTER — Other Ambulatory Visit (HOSPITAL_COMMUNITY): Payer: Self-pay

## 2022-09-12 ENCOUNTER — Encounter: Payer: Self-pay | Admitting: Nurse Practitioner

## 2022-09-12 ENCOUNTER — Other Ambulatory Visit: Payer: Self-pay

## 2022-09-12 ENCOUNTER — Encounter: Payer: Self-pay | Admitting: Physician Assistant

## 2022-09-16 ENCOUNTER — Ambulatory Visit: Payer: 59 | Attending: Hematology and Oncology | Admitting: Hematology and Oncology

## 2022-09-16 ENCOUNTER — Other Ambulatory Visit: Payer: Self-pay

## 2022-09-16 VITALS — BP 151/49 | Wt 171.4 lb

## 2022-09-16 DIAGNOSIS — Z01419 Encounter for gynecological examination (general) (routine) without abnormal findings: Secondary | ICD-10-CM

## 2022-09-16 DIAGNOSIS — F17200 Nicotine dependence, unspecified, uncomplicated: Secondary | ICD-10-CM | POA: Diagnosis not present

## 2022-09-16 DIAGNOSIS — Z1231 Encounter for screening mammogram for malignant neoplasm of breast: Secondary | ICD-10-CM | POA: Diagnosis present

## 2022-09-16 DIAGNOSIS — I2510 Atherosclerotic heart disease of native coronary artery without angina pectoris: Secondary | ICD-10-CM | POA: Insufficient documentation

## 2022-09-16 DIAGNOSIS — R9230 Dense breasts, unspecified: Secondary | ICD-10-CM | POA: Diagnosis not present

## 2022-09-16 DIAGNOSIS — R928 Other abnormal and inconclusive findings on diagnostic imaging of breast: Secondary | ICD-10-CM

## 2022-09-16 NOTE — Progress Notes (Signed)
[name] is a 62 y.o. No obstetric history on file. female who presents to [facility] clinic today with no complaints. Follow up possible calcifications in the right and left.    Pap Smear: Pap smear completed today. Last Pap smear was 08/19/2019 at [facility] clinic and was normal. Per patient has no history of an abnormal Pap smear. Last Pap smear result is available in Epic.   Physical exam: Breasts Breasts symmetrical. No skin abnormalities bilateral breasts. No nipple retraction bilateral breasts. No nipple discharge bilateral breasts. No lymphadenopathy. No lumps palpated bilateral breasts.  MM 3D SCREEN BREAST BILATERAL  Result Date: 04/27/2021 CLINICAL DATA:  Screening. EXAM: DIGITAL SCREENING BILATERAL MAMMOGRAM WITH TOMOSYNTHESIS AND CAD TECHNIQUE: Bilateral screening digital craniocaudal and mediolateral oblique mammograms were obtained. Bilateral screening digital breast tomosynthesis was performed. The images were evaluated with computer-aided detection. COMPARISON:  Previous exam(s). ACR Breast Density Category b: There are scattered areas of fibroglandular density. FINDINGS: In the right breast possible calcifications requires further evaluation. In the left breast possible calcifications requires further evaluation. IMPRESSION: Further evaluation is suggested for possible calcifications in the right breast. Further evaluation is suggested for possible calcifications in the left breast. RECOMMENDATION: Diagnostic mammogram and possibly ultrasound of both breasts. (Code:FI-B-24M) The patient will be contacted regarding the findings, and additional imaging will be scheduled. BI-RADS CATEGORY  0: Incomplete. Need additional imaging evaluation and/or prior mammograms for comparison. Electronically Signed   By: Sherian Rein M.D.   On: 04/27/2021 11:39   MM 3D SCREEN BREAST BILATERAL  Result Date: 04/13/2020 CLINICAL DATA:  Screening. EXAM: DIGITAL SCREENING BILATERAL  MAMMOGRAM WITH TOMOSYNTHESIS AND CAD TECHNIQUE: Bilateral screening digital craniocaudal and mediolateral oblique mammograms were obtained. Bilateral screening digital breast tomosynthesis was performed. The images were evaluated with computer-aided detection. COMPARISON:  Previous exam(s). ACR Breast Density Category b: There are scattered areas of fibroglandular density. FINDINGS: There are no findings suspicious for malignancy. IMPRESSION: No mammographic evidence of malignancy. A result letter of this screening mammogram will be mailed directly to the patient. RECOMMENDATION: Screening mammogram in one year. (Code:SM-B-01Y) BI-RADS CATEGORY  1: Negative. Electronically Signed   By: Gerome Sam III M.D   On: 04/13/2020 15:11   MM 3D SCREEN BREAST BILATERAL  Result Date: 10/19/2018 CLINICAL DATA:  Screening. EXAM: DIGITAL SCREENING BILATERAL MAMMOGRAM WITH TOMO AND CAD COMPARISON:  Previous exam(s). ACR Breast Density Category b: There are scattered areas of fibroglandular density. FINDINGS: There are no findings suspicious for malignancy. Images were processed with CAD. IMPRESSION: No mammographic evidence of malignancy. A result letter of this screening mammogram will be mailed directly to the patient. RECOMMENDATION: Screening mammogram in one year. (Code:SM-B-01Y) BI-RADS CATEGORY  1: Negative. Electronically Signed   By: Frederico Hamman M.D.   On: 10/19/2018 13:19         Pelvic/Bimanual Ext Genitalia No lesions, no swelling and no discharge observed on external genitalia.        Vagina Vagina pink and normal texture. No lesions or discharge observed in vagina.        Cervix Cervix is present. Cervix pink and of normal texture. No discharge observed.    Uterus Uterus is present and palpable. Uterus in normal position and normal size.        Adnexae Bilateral ovaries present and palpable. No tenderness on palpation.         Rectovaginal No rectal exam completed today since patient  had no rectal complaints. No skin abnormalities observed on exam.  Smoking History: Patient is a current smoker and was referred to quit line.    Patient Navigation: Patient education provided. Access to services provided for patient through [facility] program. Delos Haring interpreter provided. No transportation provided   Colorectal Cancer Screening: Per patient has never had colonoscopy completed No complaints today. Cologuard per PCP   Breast and Cervical Cancer Risk Assessment: Patient does not have family history of breast cancer, known genetic mutations, or radiation treatment to the chest before age 8. Patient does not have history of cervical dysplasia, immunocompromised, or DES exposure in-utero.  Risk Scores as of Encounter on 09/16/2022     Felicia Sherman           5-year 1.7%   Lifetime 6.77%            Last calculated by Narda Rutherford, LPN on 1/61/0960 at 11:42 AM        A: [facility] exam with pap smear No complaints with benign exam. Follow up possibility of calcifications.   P: [facility]. Appointment scheduled 09/16/22.  Pascal Lux, NP 09/16/2022 12:36 PM

## 2022-09-16 NOTE — Patient Instructions (Signed)
Taught Felicia Sherman about self breast awareness and gave educational materials to take home. Patient did not need a Pap smear today due to last Pap smear was in 08/19/2022 per patient. Told patient about free cervical cancer screenings to receive a Pap smear if would like one next year. Let her know [facility] will cover Pap smears every 5 years unless has a history of abnormal Pap smears. Referred [facility] to [facility] for diagnostic mammogram. Appointment scheduled for 09/16/22. Patient aware of appointment and will be there. Let patient know will follow up with her within the next couple weeks with results. Felicia Sherman verbalized understanding.  Pascal Lux, NP 11:29 AM

## 2022-09-18 ENCOUNTER — Ambulatory Visit
Admission: RE | Admit: 2022-09-18 | Discharge: 2022-09-18 | Disposition: A | Discharge status: Home / Self Care | Payer: 59 | Source: Ambulatory Visit | Attending: Obstetrics and Gynecology | Admitting: Obstetrics and Gynecology

## 2022-09-18 DIAGNOSIS — R928 Other abnormal and inconclusive findings on diagnostic imaging of breast: Secondary | ICD-10-CM | POA: Insufficient documentation

## 2022-09-19 ENCOUNTER — Other Ambulatory Visit: Payer: Self-pay | Admitting: Hematology and Oncology

## 2022-09-19 ENCOUNTER — Telehealth: Payer: Self-pay

## 2022-09-19 MED ORDER — METRONIDAZOLE 500 MG PO TABS: 500.0000 mg | ORAL_TABLET | Freq: Two times a day (BID) | ORAL | 0 refills | Status: DC

## 2022-09-19 NOTE — Addendum Note (Signed)
Addended by: Narda Rutherford on: 09/19/2022 04:46 PM   Modules accepted: Orders

## 2022-09-19 NOTE — Telephone Encounter (Signed)
Patient informed negative Pap/HPV results, revealed Bacterial Vaginosis, next pap due in 5 years. Rx metronidazole [facility] [address] [place] per Ilda Basset, NP)

## 2022-10-02 ENCOUNTER — Encounter: Payer: Self-pay | Admitting: Emergency Medicine

## 2022-10-02 ENCOUNTER — Other Ambulatory Visit: Payer: 59 | Admitting: Pharmacist

## 2022-10-02 NOTE — Patient Instructions (Signed)
Goals Addressed             This Visit's Progress    Pharmacy Goals       Our goal A1c is less than 7%. This corresponds with fasting sugars between 80-130 and 2 hour after meal sugars less than 180. Please keep a log of your results when checking your blood sugar   Our goal bad cholesterol, or LDL, is less than 70. This is why it is important to continue taking your atorvastatin.  Estelle Grumbles, PharmD, [facility] [phone]

## 2022-10-02 NOTE — Progress Notes (Cosign Needed Addendum)
10/02/2022 Name: Felicia Sherman MRN: [id number] DOB: March 13, 1960  Chief Complaint  Patient presents with   Medication Management   Medication Assistance    Felicia Sherman is a 63 y.o. year old female who presented for a telephone visit.   They were referred to the pharmacist by their PCP for assistance in managing medication access.    Subjective:  Care Team: Primary Care Provider: Berniece Salines, FNP ; Next Scheduled Visit: 01/03/2023  Medication Access/Adherence  Current Pharmacy:  [facility] - [place], [place] - [address] [place] [address] Phone: [phone] Fax: [phone]  [facility][id number] [place], [place] - [address] AT [facility] [address] [facility] [address] [place] [address] Phone: [phone] Fax: [phone]  [id number] [place], [place] - [address] [place] [address] Phone: [phone] Fax: [phone]  [facility] - [facility] [address] [place] [address] Phone: [phone] Fax: [phone]   Patient reports affordability concerns with their medications: No  Patient reports access/transportation concerns to their pharmacy: No  Patient reports adherence concerns with their medications:  No  Reports uses weekly pillbox; rarely misses a dose  Confirms cost concerns related to her insulin and pen needles are now resolved   Diabetes:   Current medications:  - metformin 1000 mg daily with breakfast - Evaristo Bury FlexTouch 32 units daily - Novolog insulin - reports takes three times daily with meals according to a previous sliding scale from [facility] for pre-meal blood sugar readings >200 Reports has misplaced her paper with sliding scale, but recalls from pre-meal blood sugar readings: 200-225: takes 10 units; 226-260: takes 12 units   Medications tried in the past: Levemir (cost/discontinued  by [employer])   Current glucose readings: recalls recent morning fasting readings ranging 60-100   Denies symptoms with recent readings in 60s  Note patient carries soft peppermints with her to use if needed to treat low blood sugar  Statin therapy: atorvastatin 80 mg daily    Tobacco Use:  Reports currently smokes 7-8 cigarettes/day Denies interest in quitting at this time   Objective:  Lab Results  Component Value Date   HGBA1C 9.2 (H) 09/02/2022    Lab Results  Component Value Date   CREATININE 1.03 09/02/2022   BUN 20 09/02/2022   NA 144 09/02/2022   K 3.8 09/02/2022   CL 112 (H) 09/02/2022   CO2 26 09/02/2022    Lab Results  Component Value Date   CHOL 162 09/02/2022   HDL 64 09/02/2022   LDLCALC 83 09/02/2022   TRIG 73 09/02/2022   CHOLHDL 2.5 09/02/2022    Medications Reviewed Today     Reviewed by Manuela Neptune, RPH-CPP (Pharmacist) on 10/02/22 at 1145  Med List Status: <None>   Medication Order Taking? Sig Documenting Provider Last Dose Status Informant  Accu-Chek Softclix Lancets lancets [id number]  Use as directed to check blood sugar up to four times daily Berniece Salines, FNP  Active   albuterol (VENTOLIN HFA) 108 (90 Base) MCG/ACT inhaler [id number] Yes Inhale 2 puffs into the lungs every 6 (six) hours as needed for wheezing or shortness of breath. [provider] Taking Active   aspirin 81 MG tablet [id number] Yes Take 81 mg by mouth daily. [provider] Taking Active Self  atorvastatin (LIPITOR) 80 MG tablet [id number] Yes Take 1  tablet (80 mg total) by mouth daily. Berniece Salines, FNP Taking Active   bimatoprost (LUMIGAN) 0.01 % SOLN [id number] Yes Place 1 drop into both eyes. [provider] Taking Active   Blood Glucose Monitoring Suppl (ACCU-CHEK GUIDE) w/Device KIT [id number]  Use to check blood sugar up to four times a day Berniece Salines, FNP  Active   dorzolamide-timolol (COSOPT) 2-0.5 % ophthalmic solution  [id number] Yes Place 1 drop into both eyes. [provider] Taking Active   fluticasone (FLONASE) 50 MCG/ACT nasal spray [id number] Yes Place 1 spray into both nostrils daily as needed. [provider] Taking Active   glucose blood (ACCU-CHEK GUIDE) test strip [id number]  Use as directed to check blood sugar up to four times daily Berniece Salines, FNP  Active   ibuprofen (ADVIL) 600 MG tablet [id number] No Take 800 mg by mouth every 8 (eight) hours as needed.  Patient not taking: Reported on 10/02/2022   [provider] Not Taking Active   insulin aspart (NOVOLOG) 100 UNIT/ML injection [id number] Yes Inject 10-16 Units into the skin 3 (three) times daily before meals. [provider] Taking Active Self           Med Note Deretha Emory, [facility]   Fri Aug 23, 2016 12:24 PM) Sliding scale  insulin degludec (TRESIBA FLEXTOUCH) 100 UNIT/ML FlexTouch Pen [id number] Yes Inject 32 Units into the skin daily. Berniece Salines, FNP Taking Active   Insulin Pen Needle 32G X 6 MM MISC [id number]  1 each by Does not apply route daily. Berniece Salines, FNP  Active   lisinopril (ZESTRIL) 10 MG tablet [id number] Yes Take 1 tablet (10 mg total) by mouth daily. Berniece Salines, FNP Taking Active   metFORMIN (GLUCOPHAGE) 1000 MG tablet [id number] Yes Take 1 tablet (1,000 mg total) by mouth daily with breakfast. Berniece Salines, FNP Taking Active   metroNIDAZOLE (FLAGYL) 500 MG tablet [id number]  Take 1 tablet (500 mg total) by mouth 2 (two) times daily. Ilda Basset A, NP  Active   Multiple Vitamin (MULTI-VITAMIN) tablet [id number] Yes Take 1 tablet by mouth daily. [provider] Taking Active   ondansetron (ZOFRAN ODT) 4 MG disintegrating tablet [id number]  Take 1 tablet (4 mg total) by mouth every 8 (eight) hours as needed for nausea or vomiting. Sharman Cheek, MD  Active            Med Note (JEFFRIES, Cottie Banda Sep 02, 2022  9:04 AM) prn  pantoprazole (PROTONIX) 40 MG tablet  [id number] Yes Take 1 tablet (40 mg total) by mouth daily. Berniece Salines, FNP Taking Active               Assessment/Plan:   Diabetes: - Currently uncontrolled - Reviewed goal A1c, goal fasting, and goal 2 hour post prandial glucose - Reviewed dietary modifications including importance of having regular well-balanced meals while controlling carbohydrate portion sizes. Advised patient against skipping meals - Collaborate with PCP regarding patient's recent morning fasting blood sugar readings/hypoglycemia and to recommend dose reduction of patient's Tresiba dose. Also share patient's request for direction for her sliding scale for Novolog PCP advises office will reach out to patient to provide her with instruction on Tresiba dose reduction and Novolog sliding scale - Review counseling with patient on s/s of low blood sugar and how to treat lows Review rules of 15s - importance of using 15 grams of sugar to treat low, recheck blood sugar in 15 minutes, treat  again if remains low or, if back to normal, having meal if mealtime or snack  Review examples of sources of 15 grams of sugar Patient reports that she carries soft peppermints with her to use if needed to treat low blood sugar - Recommend to check glucose, keep log of results and have this record to review during future appointments. Patient to contact office sooner if needed for readings outside of established parameters or if having symptoms, particularly if has any symptoms of hypoglycemia     Follow Up Plan: Clinical Pharmacist will follow up with patient by telephone on 10/28/2022 at 2:30 pm   Estelle Grumbles, PharmD, [facility] [phone]

## 2022-10-28 ENCOUNTER — Other Ambulatory Visit: Payer: 59 | Admitting: Pharmacist

## 2022-10-28 ENCOUNTER — Telehealth: Payer: Self-pay | Admitting: Pharmacist

## 2022-10-28 NOTE — Progress Notes (Unsigned)
   Outreach Note  10/28/2022 Name: CARLIANA LEINWEBER MRN: [id number] DOB: [birth year]  Referred by: Berniece Salines, FNP Reason for referral : No chief complaint on file.  Was unable to reach patient via telephone today and have left HIPAA compliant voicemail asking patient to return my call.   Follow Up Plan: Will collaborate with Care Guide to outreach to schedule follow up with me  Estelle Grumbles, PharmD, [facility] [phone]

## 2022-11-12 ENCOUNTER — Telehealth: Payer: Self-pay | Admitting: Nurse Practitioner

## 2022-11-12 ENCOUNTER — Other Ambulatory Visit: Payer: Self-pay

## 2022-11-12 DIAGNOSIS — E1165 Type 2 diabetes mellitus with hyperglycemia: Secondary | ICD-10-CM

## 2022-11-12 DIAGNOSIS — Z794 Long term (current) use of insulin: Secondary | ICD-10-CM

## 2022-11-12 NOTE — Telephone Encounter (Signed)
Pt states she needs [facility] [place]

## 2022-11-13 ENCOUNTER — Other Ambulatory Visit: Payer: Self-pay | Admitting: Emergency Medicine

## 2022-11-13 DIAGNOSIS — E1165 Type 2 diabetes mellitus with hyperglycemia: Secondary | ICD-10-CM

## 2022-11-13 MED ORDER — INSULIN SYRINGES (DISPOSABLE) U-100 0.3 ML MISC: 1.0000 [IU] | 1 refills | Status: DC

## 2022-11-13 NOTE — Telephone Encounter (Signed)
Insulin syringes 100

## 2022-11-13 NOTE — Telephone Encounter (Signed)
Script sent to [facility]

## 2022-12-18 ENCOUNTER — Other Ambulatory Visit: Payer: 59 | Admitting: Pharmacist

## 2022-12-18 NOTE — Progress Notes (Addendum)
12/18/2022 Name: Felicia Sherman MRN: [id number] DOB: Nov 03, 1960  Chief Complaint  Patient presents with   Medication Management    They were referred to the pharmacist by their PCP for assistance in managing medication access.      Subjective:   Care Team: Primary Care Provider: Berniece Salines, FNP ; Next Scheduled Visit: 01/03/2023   Medication Access/Adherence  Current Pharmacy:  [facility] - [place], [place] - [address] [place] [address] Phone: [phone] Fax: [phone]  [facility] #[id number] [place], [place] - [address] AT [facility] OF [address] [facility] [address] [place] [address] Phone: [phone] Fax: [phone]  [id number] [place], [place] - [address] [place] [address] Phone: [phone] Fax: [phone]  [facility] - [facility] [address] [place] [address] Phone: [phone] Fax: [phone]   Patient reports affordability concerns with their medications: No  Patient reports access/transportation concerns to their pharmacy: No  Patient reports adherence concerns with their medications:  No   Reports uses weekly pillbox; rarely misses a dose    Diabetes:   Current medications:  - metformin 1000 mg daily with breakfast - Evaristo Bury FlexTouch 28 units daily - Novolog insulin Sliding scale from PCP from 8/14: Check fsbs prior to meals Hold if below 110 If glucose 110- 140 give 4 units If glucose 140 to 180 give 6 units If glucose 180 to 220  give 8 units If glucose above 220 give 10 units     Medications tried in the past: Levemir (cost/discontinued by [employer])   Current glucose readings:    Before Breakfast After Supper  23 - October 66 87  24 - October 73 119  25 - October 68 101  26 - October 50 394  27 - October 133 -  28 - October 77 196  29 - October 96 136  30 - October 84    Statin therapy:  atorvastatin 80 mg daily     Tobacco Use:   Reports currently smokes 7-8 cigarettes/day Denies interest in quitting at this time   Objective:  Lab Results  Component Value Date   HGBA1C 9.2 (H) 09/02/2022    Lab Results  Component Value Date   CREATININE 1.03 09/02/2022   BUN 20 09/02/2022   NA 144 09/02/2022   K 3.8 09/02/2022   CL 112 (H) 09/02/2022   CO2 26 09/02/2022    Lab Results  Component Value Date   CHOL 162 09/02/2022   HDL 64 09/02/2022   LDLCALC 83 09/02/2022   TRIG 73 09/02/2022   CHOLHDL 2.5 09/02/2022    Medications Reviewed Today     Reviewed by Manuela Neptune, RPH-CPP (Pharmacist) on 12/18/22 at 1507  Med List Status: <None>   Medication Order Taking? Sig Documenting Provider Last Dose Status Informant  Accu-Chek Softclix Lancets lancets [id number]  Use as directed to check blood sugar up to four times daily Berniece Salines, FNP  Active   albuterol (VENTOLIN HFA) 108 (90 Base) MCG/ACT inhaler [id number] Yes Inhale 2 puffs into the lungs every 6 (six) hours as needed for wheezing or shortness of breath. [provider] Taking Active   aspirin 81 MG tablet [id number] Yes Take 81 mg by mouth daily. [provider] Taking Active Self  atorvastatin (LIPITOR) 80 MG tablet [id number] Yes Take 1 tablet (80 mg total) by  mouth daily. Berniece Salines, FNP Taking Active   bimatoprost (LUMIGAN) 0.01 % SOLN [id number] Yes Place 1 drop into both eyes. [provider] Taking Active   Blood Glucose Monitoring Suppl (ACCU-CHEK GUIDE) w/Device KIT [id number]  Use to check blood sugar up to four times a day Berniece Salines, FNP  Active   dorzolamide-timolol (COSOPT) 2-0.5 % ophthalmic solution [id number] Yes Place 1 drop into both eyes. [provider] Taking Active   fluticasone (FLONASE) 50 MCG/ACT nasal spray [id number] Yes Place 1 spray into both nostrils daily as needed. [provider] Taking Active   glucose blood  (ACCU-CHEK GUIDE) test strip [id number]  Use as directed to check blood sugar up to four times daily Berniece Salines, FNP  Active   ibuprofen (ADVIL) 600 MG tablet [id number] No Take 800 mg by mouth every 8 (eight) hours as needed.  Patient not taking: Reported on 10/02/2022   [provider] Not Taking Active   insulin aspart (NOVOLOG) 100 UNIT/ML injection [id number] Yes Inject 10-16 Units into the skin 3 (three) times daily before meals. [provider] Taking Active Self           Med Note Deretha Emory, [facility]   Fri Aug 23, 2016 12:24 PM) Sliding scale  insulin degludec (TRESIBA FLEXTOUCH) 100 UNIT/ML FlexTouch Pen [id number] Yes Inject 32 Units into the skin daily.  Patient taking differently: Inject 28 Units into the skin daily.   Berniece Salines, FNP Taking Active   Insulin Pen Needle 32G X 6 MM MISC [id number]  1 each by Does not apply route daily. Berniece Salines, FNP  Active   Insulin Syringes, Disposable, U-100 0.3 ML MISC [id number]  1 Units by Does not apply route as directed. Berniece Salines, FNP  Active   lisinopril (ZESTRIL) 10 MG tablet [id number] Yes Take 1 tablet (10 mg total) by mouth daily. Berniece Salines, FNP Taking Active   metFORMIN (GLUCOPHAGE) 1000 MG tablet [id number] Yes Take 1 tablet (1,000 mg total) by mouth daily with breakfast. Berniece Salines, FNP Taking Active   Multiple Vitamin (MULTI-VITAMIN) tablet [id number] Yes Take 1 tablet by mouth daily. [provider] Taking Active   ondansetron (ZOFRAN ODT) 4 MG disintegrating tablet [id number] Yes Take 1 tablet (4 mg total) by mouth every 8 (eight) hours as needed for nausea or vomiting. Sharman Cheek, MD Taking Active            Med Note (JEFFRIES, Cottie Banda Sep 02, 2022  9:04 AM) prn  pantoprazole (PROTONIX) 40 MG tablet [id number] Yes Take 1 tablet (40 mg total) by mouth daily. Berniece Salines, FNP Taking Active               Assessment/Plan:   Comprehensive medication review  performed; medication list updated in electronic medical record  Diabetes: - Currently uncontrolled - Reviewed goal A1c, goal fasting, and goal 2 hour post prandial glucose - Reviewed dietary modifications including importance of having regular well-balanced meals while controlling carbohydrate portion sizes. Advised patient against skipping meals - Collaborate with PCP regarding patient's recent morning fasting blood sugar readings/hypoglycemia and to recommend dose reduction of patient's Tresiba dose.   Provider agrees okay for patient to decrease Tresiba dose to 24 units nightly   Patient verbalizes understanding of plan - Review counseling with patient on s/s of low blood sugar and how to treat lows Review rules of 15s - importance of using 15 grams of sugar to treat  low, recheck blood sugar in 15 minutes, treat again if remains low or, if back to normal, having meal if mealtime or snack  Review examples of sources of 15 grams of sugar Recommend patient pick up and carry glucose tablets with her in case needed for hypoglycemia - Recommend to check glucose, keep log of results and have this record to review during future appointments. Patient to contact office sooner if needed for readings outside of established parameters or if having symptoms, particularly if has any symptoms of hypoglycemia - Remind patient to check fasting blood sugar before meals in order to follow Novolog sliding scale instructions from PCP    Follow Up Plan: Clinical Pharmacist will follow up with patient by telephone again in ~2 months   Estelle Grumbles, PharmD, [facility] [phone]

## 2022-12-18 NOTE — Patient Instructions (Signed)
Goals Addressed             This Visit's Progress    Pharmacy Goals       Please pick up a new supply of glucose tablets from the pharmacy  Our goal A1c is less than 7%. This corresponds with fasting sugars between 80-130 and 2 hour after meal sugars less than 180. Please keep a log of your results when checking your blood sugar   Our goal bad cholesterol, or LDL, is less than 70. This is why it is important to continue taking your atorvastatin.  Estelle Grumbles, PharmD, [facility] [phone]

## 2023-01-03 ENCOUNTER — Encounter: Payer: Self-pay | Admitting: Nurse Practitioner

## 2023-01-03 ENCOUNTER — Ambulatory Visit: Payer: 59 | Admitting: Nurse Practitioner

## 2023-01-03 ENCOUNTER — Other Ambulatory Visit: Payer: Self-pay

## 2023-01-03 VITALS — BP 122/76 | HR 74 | Temp 98.1°F | Resp 18 | Ht 61.0 in | Wt 167.2 lb

## 2023-01-03 DIAGNOSIS — K219 Gastro-esophageal reflux disease without esophagitis: Secondary | ICD-10-CM | POA: Diagnosis not present

## 2023-01-03 DIAGNOSIS — Z8719 Personal history of other diseases of the digestive system: Secondary | ICD-10-CM

## 2023-01-03 DIAGNOSIS — I10 Essential (primary) hypertension: Secondary | ICD-10-CM

## 2023-01-03 DIAGNOSIS — E6609 Other obesity due to excess calories: Secondary | ICD-10-CM

## 2023-01-03 DIAGNOSIS — Z794 Long term (current) use of insulin: Secondary | ICD-10-CM

## 2023-01-03 DIAGNOSIS — E782 Mixed hyperlipidemia: Secondary | ICD-10-CM

## 2023-01-03 DIAGNOSIS — E66811 Obesity, class 1: Secondary | ICD-10-CM

## 2023-01-03 DIAGNOSIS — Z6832 Body mass index (BMI) 32.0-32.9, adult: Secondary | ICD-10-CM

## 2023-01-03 DIAGNOSIS — E1165 Type 2 diabetes mellitus with hyperglycemia: Secondary | ICD-10-CM | POA: Diagnosis not present

## 2023-01-03 LAB — POCT GLYCOSYLATED HEMOGLOBIN (HGB A1C): Hemoglobin A1C: 7.1 % — AB (ref 4.0–5.6)

## 2023-01-03 MED ORDER — INSULIN SYRINGES (DISPOSABLE) U-100 0.3 ML MISC: 1.0000 [IU] | 1 refills | Status: AC

## 2023-01-03 MED ORDER — BLOOD GLUCOSE TEST VI STRP
ORAL_STRIP | Freq: Every day | 3 refills | Status: DC
Start: 2023-01-03 — End: 2023-02-03
  Filled 2023-01-03: qty 50, 50d supply, fill #0
  Filled 2023-01-06: qty 100, 90d supply, fill #0

## 2023-01-03 NOTE — Assessment & Plan Note (Signed)
Continue taking clonidine 0.1 mg and lisinopril 5 mg daily.  Blood pressure at goal.

## 2023-01-03 NOTE — Progress Notes (Signed)
BP 122/76   Pulse 74   Temp 98.1 F (36.7 C)   Resp 18   Ht 5\' 1"  (1.549 m)   Wt 167 lb 3.2 oz (75.8 kg)   SpO2 98%   BMI 31.59 kg/m    Subjective:    Patient ID: Felicia Sherman, female    DOB: 1960-09-17, 62 y.o.   MRN: [id number]  HPI: DAVEEN ROSENZWEIG is a 62 y.o. female  Chief Complaint  Patient presents with   Follow-up   Diabetes   Hypertension:  -Medications: clonidine 0.1 mg daily, lisinopril 5 mg daily -Patient is compliant with above medications and reports no side effects. -Checking BP at home (average): does not check regularly -Denies any SOB, CP, vision changes, LE edema or symptoms of hypotension -Diet: recommend DASH diet  -Exercise: recommend 150 min of physical activity weekly      01/03/2023    1:52 PM 09/16/2022   11:24 AM 09/02/2022    8:49 AM  Vitals with BMI  Height 5\' 1"   5\' 1"   Weight 167 lbs 3 oz 171 lbs 6 oz 172 lbs  BMI 31.61 32.4 32.52  Systolic 122 151 272  Diastolic 76 49 74  Pulse 74  70    Diabetes, Type 2: - last OV patient was referred for assistance with her medication -Last A1c 9.2, today 7.1 -Medications: tresiba 22 units, novolog sliding scale sometimes, metformin 1000 mg daily -Patient is compliant with the above medications and reports no side effects.  -Checking BG at home: yes -Fasting home BG: 100s -Diet: reduce sugar and processed foods in your diet  -Exercise: recommend 150 min of physical activity weekly   -Eye exam: due -Foot exam: due -Microalbumin: utd -Statin: yes -PNA vaccine: no -Denies symptoms of hypoglycemia, polyuria, polydipsia, numbness extremities, foot ulcers/trauma.    HLD:  -Medications: atorvastatin 80 mg daily -Patient is compliant with above medications and reports no side effects.  -Last lipid panel:   Lipid Panel     Component Value Date/Time   CHOL 162 09/02/2022 0952   TRIG 73 09/02/2022 0952   HDL 64 09/02/2022 0952   CHOLHDL 2.5 09/02/2022 0952   LDLCALC 83 09/02/2022 0952      GERD/history of pancreatitis: no changes She says her last pancreatitis was back in 08/25/2021 GERD control status: controlledSatisfied with current treatment? yes Heartburn frequency: none while on medication Medication side effects: no  Medication compliance: stable Dysphagia: no Odynophagia:  no Hematemesis: no Blood in stool: no EGD: no   Obesity:  Current weight : 167 lbs BMI: 31.59 Previous weight:172 lbs Treatment Tried: lifestyle modification Comorbidities: DM, HLD, HTN, GERD        01/03/2023    1:57 PM 09/02/2022   10:56 AM  Depression screen PHQ 2/9  Decreased Interest 0 0  Down, Depressed, Hopeless 0 0  PHQ - 2 Score 0 0  Altered sleeping 0   Tired, decreased energy 0   Change in appetite 0   Feeling bad or failure about yourself  0   Trouble concentrating 0   Moving slowly or fidgety/restless 0   Suicidal thoughts 0   PHQ-9 Score 0   Difficult doing work/chores Not difficult at all     Relevant past medical, surgical, family and social history reviewed and updated as indicated. Interim medical history since our last visit reviewed. Allergies and medications reviewed and updated.  Review of Systems   Constitutional: Negative for fever or weight change.  Respiratory: Negative  for cough and shortness of breath.   Cardiovascular: Negative for chest pain or palpitations.  Gastrointestinal: Negative for abdominal pain, no bowel changes.  Musculoskeletal: Negative for gait problem or joint swelling.  Skin: Negative for rash.  Neurological: Negative for dizziness or headache.  No other specific complaints in a complete review of systems (except as listed in HPI above).     Objective:    BP 122/76   Pulse 74   Temp 98.1 F (36.7 C)   Resp 18   Ht 5\' 1"  (1.549 m)   Wt 167 lb 3.2 oz (75.8 kg)   SpO2 98%   BMI 31.59 kg/m   Wt Readings from Last 3 Encounters:  01/03/23 167 lb 3.2 oz (75.8 kg)  09/16/22 171 lb 6.4 oz (77.7 kg)  09/02/22 172 lb  (78 kg)    Physical Exam  Constitutional: Patient appears well-developed and well-nourished. Obese  No distress.  HEENT: head atraumatic, normocephalic, pupils equal and reactive to light, neck supple, throat within normal limits Cardiovascular: Normal rate, regular rhythm and normal heart sounds.  No murmur heard. No BLE edema. Pulmonary/Chest: Effort normal and breath sounds normal. No respiratory distress. Abdominal: Soft.  There is no tenderness. Psychiatric: Patient has a normal mood and affect. behavior is normal. Judgment and thought content normal.   Results for orders placed or performed in visit on 01/03/23  POCT HgB A1C  Result Value Ref Range   Hemoglobin A1C 7.1 (A) 4.0 - 5.6 %   HbA1c POC (<> result, manual entry)     HbA1c, POC (prediabetic range)     HbA1c, POC (controlled diabetic range)        Assessment & Plan:   Problem List Items Addressed This Visit       Cardiovascular and Mediastinum   Hypertension    Continue taking clonidine 0.1 mg and lisinopril 5 mg daily.  Blood pressure at goal.        Digestive   Gastroesophageal reflux disease without esophagitis    Pantoprazole 40 mg daily.  Avoid triggers.        Endocrine   Type 2 diabetes mellitus with hyperglycemia, with long-term current use of insulin (HCC) - Primary    A1c is much improved down to 7.1 from 9.2.  Continue Tresiba 22 units daily metformin 1000 mg daily and NovoLog sliding scale as needed.      Relevant Medications   Insulin Syringes, Disposable, U-100 0.3 ML MISC   Glucose Blood (BLOOD GLUCOSE TEST STRIPS) STRP   Other Relevant Orders   POCT HgB A1C (Completed)   HM Diabetes Foot Exam (Completed)   Ambulatory referral to Ophthalmology     Other   Mixed hyperlipidemia    Continue atorvastatin 80 mg daily.      Class 1 obesity due to excess calories with serious comorbidity and body mass index (BMI) of 32.0 to 32.9 in adult    Continue working on lifestyle modification.       History of pancreatitis    No changes.         Follow up plan: Return in about 3 months (around 04/05/2023) for follow up.

## 2023-01-03 NOTE — Assessment & Plan Note (Signed)
A1c is much improved down to 7.1 from 9.2.  Continue Tresiba 22 units daily metformin 1000 mg daily and NovoLog sliding scale as needed.

## 2023-01-03 NOTE — Assessment & Plan Note (Signed)
Continue working on lifestyle modification.  

## 2023-01-03 NOTE — Assessment & Plan Note (Signed)
No changes

## 2023-01-03 NOTE — Assessment & Plan Note (Signed)
Pantoprazole 40 mg daily.  Avoid triggers.

## 2023-01-03 NOTE — Assessment & Plan Note (Signed)
Continue atorvastatin 80 mg daily. 

## 2023-01-06 ENCOUNTER — Other Ambulatory Visit: Payer: Self-pay

## 2023-01-20 ENCOUNTER — Other Ambulatory Visit: Payer: Self-pay

## 2023-02-03 ENCOUNTER — Other Ambulatory Visit: Payer: Self-pay | Admitting: Nurse Practitioner

## 2023-02-03 ENCOUNTER — Other Ambulatory Visit: Payer: Self-pay | Admitting: Pharmacist

## 2023-02-03 ENCOUNTER — Other Ambulatory Visit: Payer: Self-pay

## 2023-02-03 DIAGNOSIS — E1165 Type 2 diabetes mellitus with hyperglycemia: Secondary | ICD-10-CM

## 2023-02-03 MED ORDER — ACCU-CHEK GUIDE TEST VI STRP
ORAL_STRIP | 3 refills | Status: AC
Start: 2023-02-03 — End: ?
  Filled 2023-02-03: qty 100, 25d supply, fill #0

## 2023-02-03 NOTE — Patient Instructions (Signed)
Goals Addressed             This Visit's Progress    Pharmacy Goals       Our goal A1c is less than 7%. This corresponds with fasting sugars between 80-130 and 2 hour after meal sugars less than 180. Please keep a log of your results when checking your blood sugar   Our goal bad cholesterol, or LDL, is less than 70. This is why it is important to continue taking your atorvastatin.  Estelle Grumbles, PharmD, [facility] [phone]

## 2023-02-03 NOTE — Progress Notes (Signed)
02/03/2023 Name: Felicia Sherman MRN: [id number] DOB: [birth year]  Chief Complaint  Patient presents with   Medication Management    Felicia Sherman is a 62 y.o. year old female who presented for a telephone visit.   They were referred to the pharmacist by their PCP for assistance in managing medication access.      Subjective:   Care Team: Primary Care Provider: Berniece Salines, FNP    Medication Access/Adherence  Current Pharmacy:  [facility] [phone] [place], [place] - [address] [place] [address] Phone: [phone] Fax: [phone]  [facility] - [facility] [address] [place] [address] Phone: [phone] Fax: [phone]   Patient reports affordability concerns with their medications: No  Patient reports access/transportation concerns to their pharmacy: No  Patient reports adherence concerns with their medications:  No   Reports uses weekly pillbox; rarely misses a dose   Today advises that she will be changing primary care providers in January as [facility] will no longer be in network with her insurance   Requests assistance with obtaining refill of her blood glucose testing strips from pharmacy. Reports pharmacy has been unable to fill this prescription for her  Diabetes:   Current medications:  - metformin 1000 mg daily with breakfast - Evaristo Bury FlexTouch 22 units daily - Novolog insulin Sliding scale from PCP Check fsbs prior to meals Hold if below 110 If glucose 110- 140 give 4 units If glucose 140 to 180 give 6 units If glucose 180 to 220  give 8 units If glucose above 220 give 10 units     Medications tried in the past: Levemir (cost/discontinued by [employer])   Current glucose readings: morning fasting ranging 80-110; after supper ranging 190-220    Statin therapy: atorvastatin 80 mg daily   Objective:  Lab Results  Component Value Date   HGBA1C 7.1 (A)  01/03/2023    Lab Results  Component Value Date   CREATININE 1.03 09/02/2022   BUN 20 09/02/2022   NA 144 09/02/2022   K 3.8 09/02/2022   CL 112 (H) 09/02/2022   CO2 26 09/02/2022    Lab Results  Component Value Date   CHOL 162 09/02/2022   HDL 64 09/02/2022   LDLCALC 83 09/02/2022   TRIG 73 09/02/2022   CHOLHDL 2.5 09/02/2022    Medications Reviewed Today     Reviewed by Manuela Neptune, RPH-CPP (Pharmacist) on 02/03/23 at 1616  Med List Status: <None>   Medication Order Taking? Sig Documenting Provider Last Dose Status Informant  Accu-Chek Softclix Lancets lancets [id number]  Use as directed to check blood sugar up to four times daily Berniece Salines, FNP  Active   albuterol (VENTOLIN HFA) 108 (90 Base) MCG/ACT inhaler [id number]  Inhale 2 puffs into the lungs every 6 (six) hours as needed for wheezing or shortness of breath. [provider]  Active   aspirin 81 MG tablet [id number]  Take 81 mg by mouth daily. [provider]  Active Self  atorvastatin (LIPITOR) 80 MG tablet [id number]  Take 1 tablet (80 mg total) by mouth daily. Berniece Salines, FNP  Active   bimatoprost (LUMIGAN) 0.01 % SOLN [id number]  Place 1 drop into both eyes. [provider]  Active   Blood Glucose Monitoring Suppl (ACCU-CHEK GUIDE) w/Device KIT [id number]  Use to check blood sugar up to four times a day Berniece Salines, FNP  Active  dorzolamide-timolol (COSOPT) 2-0.5 % ophthalmic solution [id number]  Place 1 drop into both eyes. [provider]  Active   fluticasone (FLONASE) 50 MCG/ACT nasal spray [id number]  Place 1 spray into both nostrils daily as needed. [provider]  Active   glucose blood (ACCU-CHEK GUIDE TEST) test strip [id number] Yes Use to check blood sugar up to four times daily Berniece Salines, FNP  Active   insulin aspart (NOVOLOG) 100 UNIT/ML injection [id number] Yes Inject 10-16 Units into the skin 3 (three) times daily before meals. [provider] Taking Active Self           Med Note Deretha Emory, [facility]   Fri Aug 23, 2016 12:24 PM) Sliding scale  insulin degludec (TRESIBA FLEXTOUCH) 100 UNIT/ML FlexTouch Pen [id number] Yes Inject 32 Units into the skin daily.  Patient taking differently: Inject 22 Units into the skin daily.   Berniece Salines, FNP Taking Active   Insulin Pen Needle 32G X 6 MM MISC [id number]  1 each by Does not apply route daily. Berniece Salines, FNP  Active   Insulin Syringes, Disposable, U-100 0.3 ML MISC [id number]  1 Units by Does not apply route as directed. Berniece Salines, FNP  Active   lisinopril (ZESTRIL) 10 MG tablet [id number]  Take 1 tablet (10 mg total) by mouth daily. Berniece Salines, FNP  Active   metFORMIN (GLUCOPHAGE) 1000 MG tablet [id number] Yes Take 1 tablet (1,000 mg total) by mouth daily with breakfast. Berniece Salines, FNP Taking Active   Multiple Vitamin (MULTI-VITAMIN) tablet [id number]  Take 1 tablet by mouth daily. [provider]  Active   ondansetron (ZOFRAN ODT) 4 MG disintegrating tablet [id number]  Take 1 tablet (4 mg total) by mouth every 8 (eight) hours as needed for nausea or vomiting. Sharman Cheek, MD  Active            Med Note (JEFFRIES, Cottie Banda Sep 02, 2022  9:04 AM) prn  pantoprazole (PROTONIX) 40 MG tablet [id number]  Take 1 tablet (40 mg total) by mouth daily. Berniece Salines, FNP  Active               Assessment/Plan:   Patient plans to reach out to PCP to request advice regarding finding a new PCP as [facility] is no longer in network for her insurance   Diabetes: - Have reviewed dietary modifications including importance of having regular well-balanced meals while controlling carbohydrate portion sizes. Advised patient against skipping meals - Have counseled patient on s/s of low blood sugar and how to treat lows Recommend to carry glucose tablets with her in case needed for hypoglycemia - Recommend to check glucose, keep  log of results and have this record to review during future appointments. Patient to contact office sooner if needed for readings outside of established parameters or if having symptoms, particularly if has any symptoms of hypoglycemia - Outreach to [facility] today on behalf of patient. Speak with Pam who advises pharmacy was unable to refill previous Accu-Chek Guide prescription for patient as this prescription was inactivated when latest Rx sent to pharmacy in November. However, note that latest Rx direction is for patient to test blood sugar once daily - Will send new prescription for Accu-Chek Guide test strips with direction for patient to use to test her blood sugar up to four times daily per protocol   Follow Up Plan:   Patient denies further medication questions or  concerns today No further follow up indicated as patient has advised that she will be switching to a new primary care provider office    Estelle Grumbles, PharmD, [facility] [phone]

## 2023-02-04 NOTE — Telephone Encounter (Signed)
Requested Prescriptions  Refused Prescriptions Disp Refills   ACCU-CHEK GUIDE TEST test strip [Pharmacy Med Name: glucose blood (ACCU-CHEK GUIDE) test strip] 400 each 3    Sig: Use as directed to check blood sugar up to four times daily     There is no refill protocol information for this order

## 2023-02-21 ENCOUNTER — Other Ambulatory Visit: Payer: Self-pay | Admitting: Nurse Practitioner

## 2023-02-21 DIAGNOSIS — E1165 Type 2 diabetes mellitus with hyperglycemia: Secondary | ICD-10-CM

## 2023-02-24 NOTE — Telephone Encounter (Signed)
 Requested Prescriptions  Pending Prescriptions Disp Refills   metFORMIN  (GLUCOPHAGE ) 1000 MG tablet [Pharmacy Med Name: METFORMIN  HCL 1,000 MG TABLET] 90 tablet 0    Sig: TAKE 1 TABLET BY MOUTH EVERY DAY WITH BREAKFAST     Endocrinology:  Diabetes - Biguanides Failed - 02/24/2023  3:40 PM      Failed - B12 Level in normal range and within 720 days    No results found for: VITAMINB12       Passed - Cr in normal range and within 360 days    Creat  Date Value Ref Range Status  09/02/2022 1.03 0.50 - 1.05 mg/dL Final   Creatinine, Urine  Date Value Ref Range Status  09/02/2022 191 20 - 275 mg/dL Final         Passed - HBA1C is between 0 and 7.9 and within 180 days    Hemoglobin A1C  Date Value Ref Range Status  01/03/2023 7.1 (A) 4.0 - 5.6 % Final   Hgb A1c MFr Bld  Date Value Ref Range Status  09/02/2022 9.2 (H) <5.7 % of total Hgb Final    Comment:    For someone without known diabetes, a hemoglobin A1c value of 6.5% or greater indicates that they may have  diabetes and this should be confirmed with a follow-up  test. . For someone with known diabetes, a value <7% indicates  that their diabetes is well controlled and a value  greater than or equal to 7% indicates suboptimal  control. A1c targets should be individualized based on  duration of diabetes, age, comorbid conditions, and  other considerations. . Currently, no consensus exists regarding use of hemoglobin A1c for diagnosis of diabetes for children. .          Passed - eGFR in normal range and within 360 days    GFR calc Af Amer  Date Value Ref Range Status  08/23/2016 >60 >60 mL/min Final    Comment:    (NOTE) The eGFR has been calculated using the CKD EPI equation. This calculation has not been validated in all clinical situations. eGFR's persistently <60 mL/min signify possible Chronic Kidney Disease.    GFR calc non Af Amer  Date Value Ref Range Status  08/23/2016 55 (L) >60 mL/min Final   eGFR   Date Value Ref Range Status  09/02/2022 61 > OR = 60 mL/min/1.67m2 Final         Passed - Valid encounter within last 6 months    Recent Outpatient Visits           1 month ago Type 2 diabetes mellitus with hyperglycemia, with long-term current use of insulin  [facility])   [facility] Gareth Mliss FALCON, FNP   5 months ago Pain in other joint   [facility] Gareth Mliss FALCON, FNP       Future Appointments             In 1 month Gareth, Mliss FALCON, FNP [facility]            Passed - CBC within normal limits and completed in the last 12 months    WBC  Date Value Ref Range Status  09/02/2022 7.9 3.8 - 10.8 Thousand/uL Final   RBC  Date Value Ref Range Status  09/02/2022 3.94 3.80 - 5.10 Million/uL Final   Hemoglobin  Date Value Ref Range Status  09/02/2022 11.6 (L) 11.7 - 15.5 g/dL Final  HCT  Date Value Ref Range Status  09/02/2022 35.9 35.0 - 45.0 % Final   MCHC  Date Value Ref Range Status  09/02/2022 32.3 32.0 - 36.0 g/dL Final   [facility]  Date Value Ref Range Status  09/02/2022 29.4 27.0 - 33.0 pg Final   MCV  Date Value Ref Range Status  09/02/2022 91.1 80.0 - 100.0 fL Final   No results found for: PLTCOUNTKUC, LABPLAT, POCPLA RDW  Date Value Ref Range Status  09/02/2022 12.6 11.0 - 15.0 % Final

## 2023-03-06 ENCOUNTER — Ambulatory Visit: Payer: Self-pay | Admitting: Nurse Practitioner

## 2023-03-16 ENCOUNTER — Other Ambulatory Visit: Payer: Self-pay | Admitting: Nurse Practitioner

## 2023-03-16 DIAGNOSIS — I10 Essential (primary) hypertension: Secondary | ICD-10-CM

## 2023-03-17 NOTE — Telephone Encounter (Signed)
Requested Prescriptions  Pending Prescriptions Disp Refills   lisinopril (ZESTRIL) 10 MG tablet [Pharmacy Med Name: LISINOPRIL 10 MG TABLET] 90 tablet 0    Sig: TAKE 1 TABLET BY MOUTH EVERY DAY     Cardiovascular:  ACE Inhibitors Failed - 03/17/2023  4:05 PM      Failed - Cr in normal range and within 180 days    Creat  Date Value Ref Range Status  09/02/2022 1.03 0.50 - 1.05 mg/dL Final   Creatinine, Urine  Date Value Ref Range Status  09/02/2022 191 20 - 275 mg/dL Final         Failed - K in normal range and within 180 days    Potassium  Date Value Ref Range Status  09/02/2022 3.8 3.5 - 5.3 mmol/L Final         Passed - Patient is not pregnant      Passed - Last BP in normal range    BP Readings from Last 1 Encounters:  01/03/23 122/76         Passed - Valid encounter within last 6 months    Recent Outpatient Visits           2 months ago Type 2 diabetes mellitus with hyperglycemia, with long-term current use of insulin [facility])   [facility] Berniece Salines, FNP   6 months ago Pain in other joint   [facility] Berniece Salines, FNP       Future Appointments             In 2 weeks Zane Herald, Rudolpho Sevin, FNP [facility]

## 2023-04-04 ENCOUNTER — Ambulatory Visit: Payer: 59 | Admitting: Nurse Practitioner

## 2023-06-26 ENCOUNTER — Other Ambulatory Visit: Payer: Self-pay | Admitting: Nurse Practitioner

## 2023-06-26 DIAGNOSIS — I10 Essential (primary) hypertension: Secondary | ICD-10-CM

## 2023-06-27 NOTE — Telephone Encounter (Signed)
 Called pt to make appt and pt stated she has no health insurance and wont be able to make an appt. Pt stated she has a week or so left of the lisinopril .  Med last RF: 12/20/22 #90 1 RF Overdue lab work Routing to PCP for review  Requested Prescriptions  Pending Prescriptions Disp Refills   lisinopril  (ZESTRIL ) 10 MG tablet [Pharmacy Med Name: LISINOPRIL  10 MG TABLET] 90 tablet 0    Sig: TAKE 1 TABLET BY MOUTH EVERY DAY     Cardiovascular:  ACE Inhibitors Failed - 06/27/2023  2:28 PM      Failed - Cr in normal range and within 180 days    Creat  Date Value Ref Range Status  09/02/2022 1.03 0.50 - 1.05 mg/dL Final   Creatinine, Urine  Date Value Ref Range Status  09/02/2022 191 20 - 275 mg/dL Final         Failed - K in normal range and within 180 days    Potassium  Date Value Ref Range Status  09/02/2022 3.8 3.5 - 5.3 mmol/L Final         Failed - Valid encounter within last 6 months    Recent Outpatient Visits   None            Passed - Patient is not pregnant      Passed - Last BP in normal range    BP Readings from Last 1 Encounters:  01/03/23 122/76

## 2023-07-20 ENCOUNTER — Other Ambulatory Visit: Payer: Self-pay | Admitting: Nurse Practitioner

## 2023-07-20 DIAGNOSIS — E1165 Type 2 diabetes mellitus with hyperglycemia: Secondary | ICD-10-CM

## 2023-07-20 DIAGNOSIS — I10 Essential (primary) hypertension: Secondary | ICD-10-CM

## 2023-07-22 NOTE — Telephone Encounter (Signed)
 OFFICE VISIT NEEDED FOR ADDITIONAL REFILLS   Requested Prescriptions  Pending Prescriptions Disp Refills   lisinopril  (ZESTRIL ) 10 MG tablet [Pharmacy Med Name: LISINOPRIL  10 MG TABLET] 30 tablet 0    Sig: TAKE 1 TABLET BY MOUTH EVERY DAY     Cardiovascular:  ACE Inhibitors Failed - 07/22/2023  9:00 AM      Failed - Cr in normal range and within 180 days    Creat  Date Value Ref Range Status  09/02/2022 1.03 0.50 - 1.05 mg/dL Final   Creatinine, Urine  Date Value Ref Range Status  09/02/2022 191 20 - 275 mg/dL Final         Failed - K in normal range and within 180 days    Potassium  Date Value Ref Range Status  09/02/2022 3.8 3.5 - 5.3 mmol/L Final         Failed - Valid encounter within last 6 months    Recent Outpatient Visits   None            Passed - Patient is not pregnant      Passed - Last BP in normal range    BP Readings from Last 1 Encounters:  01/03/23 122/76          metFORMIN  (GLUCOPHAGE ) 1000 MG tablet [Pharmacy Med Name: METFORMIN  HCL 1,000 MG TABLET] 30 tablet 0    Sig: TAKE 1 TABLET BY MOUTH EVERY DAY WITH BREAKFAST     Endocrinology:  Diabetes - Biguanides Failed - 07/22/2023  9:00 AM      Failed - HBA1C is between 0 and 7.9 and within 180 days    Hemoglobin A1C  Date Value Ref Range Status  01/03/2023 7.1 (A) 4.0 - 5.6 % Final   Hgb A1c MFr Bld  Date Value Ref Range Status  09/02/2022 9.2 (H) <5.7 % of total Hgb Final    Comment:    For someone without known diabetes, a hemoglobin A1c value of 6.5% or greater indicates that they may have  diabetes and this should be confirmed with a follow-up  test. . For someone with known diabetes, a value <7% indicates  that their diabetes is well controlled and a value  greater than or equal to 7% indicates suboptimal  control. A1c targets should be individualized based on  duration of diabetes, age, comorbid conditions, and  other considerations. . Currently, no consensus exists regarding use  of hemoglobin A1c for diagnosis of diabetes for children. .          Failed - B12 Level in normal range and within 720 days    No results found for: "VITAMINB12"       Failed - Valid encounter within last 6 months    Recent Outpatient Visits   None            Passed - Cr in normal range and within 360 days    Creat  Date Value Ref Range Status  09/02/2022 1.03 0.50 - 1.05 mg/dL Final   Creatinine, Urine  Date Value Ref Range Status  09/02/2022 191 20 - 275 mg/dL Final         Passed - eGFR in normal range and within 360 days    GFR calc Af Amer  Date Value Ref Range Status  08/23/2016 >60 >60 mL/min Final    Comment:    (NOTE) The eGFR has been calculated using the CKD EPI equation. This calculation has not been validated in all clinical situations. eGFR's  persistently <60 mL/min signify possible Chronic Kidney Disease.    GFR calc non Af Amer  Date Value Ref Range Status  08/23/2016 55 (L) >60 mL/min Final   eGFR  Date Value Ref Range Status  09/02/2022 61 > OR = 60 mL/min/1.6m2 Final         Passed - CBC within normal limits and completed in the last 12 months    WBC  Date Value Ref Range Status  09/02/2022 7.9 3.8 - 10.8 Thousand/uL Final   RBC  Date Value Ref Range Status  09/02/2022 3.94 3.80 - 5.10 Million/uL Final   Hemoglobin  Date Value Ref Range Status  09/02/2022 11.6 (L) 11.7 - 15.5 g/dL Final   HCT  Date Value Ref Range Status  09/02/2022 35.9 35.0 - 45.0 % Final   MCHC  Date Value Ref Range Status  09/02/2022 32.3 32.0 - 36.0 g/dL Final   [facility]  Date Value Ref Range Status  09/02/2022 29.4 27.0 - 33.0 pg Final   MCV  Date Value Ref Range Status  09/02/2022 91.1 80.0 - 100.0 fL Final   No results found for: "PLTCOUNTKUC", "LABPLAT", "POCPLA" RDW  Date Value Ref Range Status  09/02/2022 12.6 11.0 - 15.0 % Final

## 2023-10-02 ENCOUNTER — Ambulatory Visit
Admission: RE | Admit: 2023-10-02 | Discharge: 2023-10-02 | Disposition: A | Discharge status: Home / Self Care | Payer: Self-pay | Source: Ambulatory Visit | Attending: Nurse Practitioner | Admitting: Nurse Practitioner

## 2023-10-02 ENCOUNTER — Ambulatory Visit: Payer: Self-pay | Admitting: Nurse Practitioner

## 2023-10-02 ENCOUNTER — Encounter: Payer: Self-pay | Admitting: Nurse Practitioner

## 2023-10-02 ENCOUNTER — Ambulatory Visit
Admission: RE | Admit: 2023-10-02 | Discharge: 2023-10-02 | Disposition: A | Discharge status: Home / Self Care | Payer: Self-pay | Attending: Nurse Practitioner | Admitting: Nurse Practitioner

## 2023-10-02 VITALS — BP 152/74 | HR 76 | Temp 98.1°F | Resp 16 | Ht 61.0 in | Wt 179.7 lb

## 2023-10-02 DIAGNOSIS — Z111 Encounter for screening for respiratory tuberculosis: Secondary | ICD-10-CM

## 2023-10-02 DIAGNOSIS — Z794 Long term (current) use of insulin: Secondary | ICD-10-CM

## 2023-10-02 DIAGNOSIS — E1165 Type 2 diabetes mellitus with hyperglycemia: Secondary | ICD-10-CM

## 2023-10-02 DIAGNOSIS — E782 Mixed hyperlipidemia: Secondary | ICD-10-CM

## 2023-10-02 DIAGNOSIS — K219 Gastro-esophageal reflux disease without esophagitis: Secondary | ICD-10-CM

## 2023-10-02 DIAGNOSIS — I10 Essential (primary) hypertension: Secondary | ICD-10-CM

## 2023-10-02 MED ORDER — TRESIBA FLEXTOUCH 100 UNIT/ML ~~LOC~~ SOPN
32.0000 [IU] | PEN_INJECTOR | Freq: Every day | SUBCUTANEOUS | 1 refills | Status: DC
Start: 2023-10-02 — End: 2023-10-07

## 2023-10-02 MED ORDER — PANTOPRAZOLE SODIUM 40 MG PO TBEC
40.0000 mg | DELAYED_RELEASE_TABLET | Freq: Every day | ORAL | 3 refills | Status: DC
Start: 2023-10-02 — End: 2023-10-07

## 2023-10-02 MED ORDER — ATORVASTATIN CALCIUM 80 MG PO TABS
80.0000 mg | ORAL_TABLET | Freq: Every day | ORAL | 1 refills | Status: DC
Start: 2023-10-02 — End: 2023-10-07

## 2023-10-02 MED ORDER — LISINOPRIL 10 MG PO TABS
10.0000 mg | ORAL_TABLET | Freq: Every day | ORAL | 1 refills | Status: DC
Start: 2023-10-02 — End: 2023-10-07

## 2023-10-02 MED ORDER — METFORMIN HCL 1000 MG PO TABS
1000.0000 mg | ORAL_TABLET | Freq: Every day | ORAL | 1 refills | Status: DC
Start: 2023-10-02 — End: 2023-10-07

## 2023-10-02 NOTE — Progress Notes (Signed)
 BP (!) 152/74   Pulse 76   Temp 98.1 F (36.7 C)   Resp 16   Ht 5' 1 (1.549 m)   Wt 179 lb 11.2 oz (81.5 kg)   SpO2 98%   BMI 33.95 kg/m    Subjective:    Patient ID: Felicia Sherman, female    DOB: 03-30-1960, 63 y.o.   MRN: [id number]  HPI: Felicia Sherman is a 63 y.o. female  Chief Complaint  Patient presents with   Medical Management of Chronic Issues    Discussed the use of AI scribe software for clinical note transcription with the patient, who gave verbal consent to proceed.  History of Present Illness Felicia Sherman is a 63 year old female with type two diabetes who presents for medication refills and a chest x-ray.  Hyperglycemia and diabetes management - Type 2 diabetes mellitus with poor glycemic control due to medication nonadherence secondary to financial constraints and lack of insurance (Medicaid pending) - Metformin  not taken for over two months - Insulin  use limited by affordability; manages medication intake based on cost - Blood glucose levels elevated, with nighttime readings as high as 346 mg/dL and a peak of 555 mg/dL - Concern regarding persistently high blood glucose levels - Inconsistent blood glucose monitoring  Medication access and adherence - Currently without insurance, with Medicaid application pending - Unable to afford all prescribed medications, prioritizes based on affordability - Current medications include: albuterol inhaler as needed, aspirin 81 mg daily, atorvastatin  80 mg daily, Flonase as needed, Novolog sliding scale, Tresiba  32 or 22 units daily, lisinopril  10 mg daily, metformin  1000 mg daily, pantoprazole  40 mg daily  Tuberculosis screening history - History of positive skin test for tuberculosis - More than five years since last tuberculosis test         01/03/2023    1:57 PM 09/02/2022   10:56 AM  Depression screen PHQ 2/9  Decreased Interest 0 0  Down, Depressed, Hopeless 0 0  PHQ - 2 Score 0 0  Altered  sleeping 0   Tired, decreased energy 0   Change in appetite 0   Feeling bad or failure about yourself  0   Trouble concentrating 0   Moving slowly or fidgety/restless 0   Suicidal thoughts 0   PHQ-9 Score 0   Difficult doing work/chores Not difficult at all     Relevant past medical, surgical, family and social history reviewed and updated as indicated. Interim medical history since our last visit reviewed. Allergies and medications reviewed and updated.  Review of Systems  Constitutional: Negative for fever or weight change.  Respiratory: Negative for cough and shortness of breath.   Cardiovascular: Negative for chest pain or palpitations.  Gastrointestinal: Negative for abdominal pain, no bowel changes.  Musculoskeletal: Negative for gait problem or joint swelling.  Skin: Negative for rash.  Neurological: Negative for dizziness or headache.  No other specific complaints in a complete review of systems (except as listed in HPI above).      Objective:     BP (!) 152/74   Pulse 76   Temp 98.1 F (36.7 C)   Resp 16   Ht 5' 1 (1.549 m)   Wt 179 lb 11.2 oz (81.5 kg)   SpO2 98%   BMI 33.95 kg/m    Wt Readings from Last 3 Encounters:  10/02/23 179 lb 11.2 oz (81.5 kg)  01/03/23 167 lb 3.2 oz (75.8 kg)  09/16/22 171 lb 6.4 oz (77.7  kg)    Physical Exam Physical Exam VITALS: BP- 148/62 GENERAL: Alert, cooperative, well developed, no acute distress HEENT: Normocephalic, normal oropharynx, moist mucous membranes CHEST: Clear to auscultation bilaterally, No wheezes, rhonchi, or crackles CARDIOVASCULAR: Normal heart rate and rhythm, S1 and S2 normal without murmurs ABDOMEN: Soft, non-tender, non-distended, without organomegaly, Normal bowel sounds EXTREMITIES: No cyanosis or edema NEUROLOGICAL: Cranial nerves grossly intact, Moves all extremities without gross motor or sensory deficit   Results for orders placed or performed in visit on 01/03/23  POCT HgB A1C    Collection Time: 01/03/23  2:08 PM  Result Value Ref Range   Hemoglobin A1C 7.1 (A) 4.0 - 5.6 %   HbA1c POC (<> result, manual entry)     HbA1c, POC (prediabetic range)     HbA1c, POC (controlled diabetic range)            Assessment & Plan:   Problem List Items Addressed This Visit       Cardiovascular and Mediastinum   Hypertension   Relevant Medications   atorvastatin  (LIPITOR) 80 MG tablet   lisinopril  (ZESTRIL ) 10 MG tablet   Other Relevant Orders   AMB Referral VBCI Care Management   CBC with Differential/Platelet   Comprehensive metabolic panel with GFR     Digestive   Gastroesophageal reflux disease without esophagitis   Relevant Medications   pantoprazole  (PROTONIX ) 40 MG tablet     Endocrine   Type 2 diabetes mellitus with hyperglycemia, with long-term current use of insulin  (HCC)   Relevant Medications   atorvastatin  (LIPITOR) 80 MG tablet   insulin  degludec (TRESIBA  FLEXTOUCH) 100 UNIT/ML FlexTouch Pen   lisinopril  (ZESTRIL ) 10 MG tablet   metFORMIN  (GLUCOPHAGE ) 1000 MG tablet   Other Relevant Orders   AMB Referral VBCI Care Management   Comprehensive metabolic panel with GFR   Microalbumin / creatinine urine ratio   Hemoglobin A1c     Other   Mixed hyperlipidemia   Relevant Medications   atorvastatin  (LIPITOR) 80 MG tablet   lisinopril  (ZESTRIL ) 10 MG tablet   Other Relevant Orders   AMB Referral VBCI Care Management   Comprehensive metabolic panel with GFR   Lipid panel   Other Visit Diagnoses       Screening-pulmonary TB    -  Primary   Relevant Orders   DG Chest 2 View        Assessment and Plan Assessment & Plan Type 2 diabetes mellitus Recent episodes of hyperglycemia with blood sugar levels of 346, 400, and 444 mg/dL. She has not been taking metformin  for over two months due to insurance issues. - [facility] . - Order lab work to be completed once insurance is active. - Coordinate  with pharmacy assistance to ensure medication access.  Hypertension Current blood pressure reading of 148/62 mmHg. Difficulty obtaining medications due to insurance issues. - [facility], including l[facility]opril . - Coordinate with pharmacy assistance to ensure medication access.  Hyperlipidemia Currently managed with atorvastatin . Difficulty obtaining medications due to insurance issues. - [facility], including atorvastatin . - Coordinate with pharmacy assistance to ensure medication access.  Obesity No specific discussion or plan mentioned.  Gastroesophageal reflux disease (GERD) Difficulty obtaining medications due to insurance issues. - [facility], including pantoprazole . - Coordinate with pharmacy assistance to ensure medication access.  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  Requires a chest x-ray for employment purposes. - Order chest x-ray.        Follow up plan: Return in about 3 months (around 01/02/2024) for follow up.

## 2023-10-06 ENCOUNTER — Telehealth: Payer: Self-pay

## 2023-10-06 NOTE — Progress Notes (Signed)
 Complex Care Management Note  Care Guide Note 10/06/2023 Name: Felicia Sherman MRN: [id number] DOB: Jul 14, 1960  Felicia Sherman is a 63 y.o. year old female who sees Gareth Mliss FALCON, FNP for primary care. I reached out to Felicia Sherman by phone today to offer complex care management services.  [name] was given information about Complex Care Management services today including:   The Complex Care Management services include support from the care team which includes your Nurse Care Manager, Clinical Social Worker, or Pharmacist.  The Complex Care Management team is here to help remove barriers to the health concerns and goals most important to you. Complex Care Management services are voluntary, and the patient may decline or stop services at any time by request to their care team member.   Complex Care Management Consent Status: Patient agreed to services and verbal consent obtained.   Follow up plan:  Telephone appointment with complex care management team member scheduled for:  10/07/23 at 11:00 a.m.   Encounter Outcome:  Patient Scheduled  Dream[facility], [facility] VBCI Assistant Direct Dial : [phone]  Fax: [phone]

## 2023-10-07 ENCOUNTER — Other Ambulatory Visit (INDEPENDENT_AMBULATORY_CARE_PROVIDER_SITE_OTHER): Payer: Self-pay

## 2023-10-07 ENCOUNTER — Other Ambulatory Visit: Payer: Self-pay

## 2023-10-07 DIAGNOSIS — Z7984 Long term (current) use of oral hypoglycemic drugs: Secondary | ICD-10-CM

## 2023-10-07 DIAGNOSIS — I10 Essential (primary) hypertension: Secondary | ICD-10-CM

## 2023-10-07 DIAGNOSIS — Z794 Long term (current) use of insulin: Secondary | ICD-10-CM

## 2023-10-07 DIAGNOSIS — E1165 Type 2 diabetes mellitus with hyperglycemia: Secondary | ICD-10-CM

## 2023-10-07 DIAGNOSIS — K219 Gastro-esophageal reflux disease without esophagitis: Secondary | ICD-10-CM

## 2023-10-07 DIAGNOSIS — E782 Mixed hyperlipidemia: Secondary | ICD-10-CM

## 2023-10-07 MED ORDER — BASAGLAR KWIKPEN 100 UNIT/ML ~~LOC~~ SOPN
32.0000 [IU] | PEN_INJECTOR | Freq: Every day | SUBCUTANEOUS | 1 refills | Status: AC
  Filled 2023-10-07: qty 15, 46d supply, fill #0

## 2023-10-07 MED ORDER — LISINOPRIL 10 MG PO TABS
10.0000 mg | ORAL_TABLET | Freq: Every day | ORAL | 1 refills | Status: AC
  Filled 2023-10-07: qty 90, 90d supply, fill #0

## 2023-10-07 MED ORDER — METFORMIN HCL 500 MG PO TABS
500.0000 mg | ORAL_TABLET | Freq: Two times a day (BID) | ORAL | 1 refills | Status: AC
Start: 2023-10-07 — End: ?
  Filled 2023-10-07: qty 180, 90d supply, fill #0

## 2023-10-07 MED ORDER — ESOMEPRAZOLE MAGNESIUM 20 MG PO CPDR
20.0000 mg | DELAYED_RELEASE_CAPSULE | Freq: Every day | ORAL | 1 refills | Status: AC
Start: 2023-10-07 — End: ?
  Filled 2023-10-07: qty 90, 90d supply, fill #0

## 2023-10-07 MED ORDER — ATORVASTATIN CALCIUM 40 MG PO TABS
40.0000 mg | ORAL_TABLET | Freq: Every day | ORAL | 1 refills | Status: AC
Start: 2023-10-07 — End: ?
  Filled 2023-10-07: qty 90, 90d supply, fill #0

## 2023-10-07 MED ORDER — INSULIN LISPRO (1 UNIT DIAL) 100 UNIT/ML (KWIKPEN)
5.0000 [IU] | PEN_INJECTOR | Freq: Two times a day (BID) | SUBCUTANEOUS | 1 refills | Status: AC
  Filled 2023-10-07: qty 15, 150d supply, fill #0

## 2023-10-07 NOTE — Progress Notes (Signed)
 S:     Chief Complaint  Patient presents with   Medication Management    Reason for visit: ?  Felicia Sherman is a 63 y.o. female with a history of diabetes (type 2), who presents today for an initial diabetes pharmacotherapy visit.? Pertinent PMH also includes HLD, HTN, GERD, hx of pancreatitis, and obesity.  Care Team: Primary Care Provider: Gareth Mliss FALCON, FNP  At last visit with PCP on 10/02/23, patient reported inconsistent medication use due pending Medicaid insurance and overall inaffordability. Patient reported hyperglycemia with BG levels around 340-444.  Since last visit, patient reports picking up all medications from the pharmacy for ~$150. She states she still has insulin  at home. Of note, she has been taking Novolog by sliding scale once daily before bed. She reports typical dose of 10-12 units daily.   Patient reports Diabetes was diagnosed around 63 years of age.   Current diabetes medications include: metformin  1000 mg daily, Tresiba  32 units daily, Novolog sliding scale with meals (taking before bed at 10-12 units) Current hypertension medications include: lisinopril  10 mg daily Current hyperlipidemia medications include: atorvastatin  80 mg daily  Patient reports adherence to taking all medications as prescribed. She reports previous non-adherence to medications.  Have you been experiencing any side effects to the medications prescribed? no Do you have any problems obtaining medications due to transportation or finances? yes Insurance coverage: self-pay; currently enrolling in [place]  Patient denies hypoglycemic events.  Reported home fasting blood sugars: 80-120s mg/dL  Reported evening blood sugars: 200-250s mg/dL  Patient denies nocturia (nighttime urination).  Patient denies neuropathy (nerve pain). Patient denies visual changes. Patient reports self foot exams.   Patient reported dietary habits: Eats 2 meals/day  Brunch: biscuit and gravy, hash  browns Dinner: tv dinners Snacks: crackers and cheese Drinks: water, grape soda (every other day), coffee *has dentures*  Patient-reported exercise habits: no formal exercise outside of work  DM Prevention:  Statin: Taking Last eye exam: Due Tobacco Use:   Tobacco Use: High Risk (10/02/2023)   Patient History    Smoking Tobacco Use: Every Day    Smokeless Tobacco Use: Never    Passive Exposure: Current   Hypertension: Patient does not have a validated, automated, upper arm home BP cuff  Patient denies hypotensive s/sx including dizziness, lightheadedness.  Patient denies hypertensive symptoms including headache, chest pain, shortness of breath   O:   Vitals:  Wt Readings from Last 3 Encounters:  10/02/23 179 lb 11.2 oz (81.5 kg)  01/03/23 167 lb 3.2 oz (75.8 kg)  09/16/22 171 lb 6.4 oz (77.7 kg)   BP Readings from Last 3 Encounters:  10/02/23 (!) 152/74  01/03/23 122/76  09/16/22 (!) 151/49   Pulse Readings from Last 3 Encounters:  10/02/23 76  01/03/23 74  09/02/22 70     Labs:?  Lab Results  Component Value Date   HGBA1C 7.1 (A) 01/03/2023   HGBA1C 9.2 (H) 09/02/2022   GLUCOSE 53 (L) 09/02/2022   MICRALBCREAT 51 (H) 09/02/2022   CREATININE 1.03 09/02/2022   CREATININE 1.10 (H) 08/23/2016    Lab Results  Component Value Date   CHOL 162 09/02/2022   LDLCALC 83 09/02/2022   HDL 64 09/02/2022   TRIG 73 09/02/2022   ALT 14 09/02/2022   ALT 18 08/23/2016   AST 20 09/02/2022   AST 28 08/23/2016      Chemistry      Component Value Date/Time   NA 144 09/02/2022 9047  K 3.8 09/02/2022 0952   CL 112 (H) 09/02/2022 0952   CO2 26 09/02/2022 0952   BUN 20 09/02/2022 0952   CREATININE 1.03 09/02/2022 0952      Component Value Date/Time   CALCIUM  9.7 09/02/2022 0952   ALKPHOS 73 08/23/2016 1121   AST 20 09/02/2022 0952   ALT 14 09/02/2022 0952   BILITOT 0.4 09/02/2022 0952       The 10-year ASCVD risk score (Arnett DK, et al., 2019) is:  40.8%  Lab Results  Component Value Date   MICRALBCREAT 51 (H) 09/02/2022    A/P: Diabetes longstanding diagnosis currently uncontrolled with a most recent A1c of 7.1% on 01/03/23, which is down from 9.2% on 09/03/22. Patient restarted her oral DM medications within the last week. Reported fasting BG levels at goal, however, bedtime BG levels are elevated around 200-250 mg/dL on average. Patient has been administering Novolog per sliding scale once daily before bed at a typical dose of 10-12 units daily. Medicaid application submitted per patient about 3 weeks ago. Will attempt to obtain medications for free from [facility] via [facility] in the interim. Will have to adjust based on [facility] formulary. Additionally, given her confusion with short-acting insulin  administration, will adjust to a set dose prior to meals. -Started basal insulin  Basaglar (insulin  glargine) 32 units daily -Discontinued Tresiba  (insulin  degludec)  -Discontinued rapid insulin  Novolog (insulin  aspart)  -Started Humalog  (insulin  lispro) 5 units BID before meals -Adjusted dose of metformin  from 1000 mg daily to 500 mg BID with meals.  -Patient educated on purpose, proper use, and potential adverse effects of insulin .  -Extensively discussed pathophysiology of diabetes, recommended lifestyle interventions, dietary effects on blood sugar control.  -Counseled on s/sx of and management of hypoglycemia.   ASCVD risk - primary  prevention in patient with diabetes. Last LDL is 83 not at goal of <70 mg/dL. Will adjust atorvastatin  dose according to [facility] formulary. -Adjusted dose of atorvastatin  from 80 to 40 mg daily.   Hypertension longstanding currently uncontrolled. Blood pressure goal of <130/80 mmHg. Will attempt to obtain a home BP cuff once insurance is obtained. -Continued lisinopril  5 mg daily.  GERD: Currently managed on pantoprazole  therapy. Will adjust based on [facility] medications in stock. -Discontinued  pantoprazole   -Started esomeprazole  20 mg daily  Patient verbalized understanding of treatment plan. Confirmed instructions with teach back. Total pharmacist counseling 45 minutes.    Follow-up:  Pharmacist on 10/14/23  Peyton CHARLENA Ferries, PharmD Clinical Pharmacist [facility] [phone]

## 2023-10-09 ENCOUNTER — Other Ambulatory Visit: Payer: Self-pay

## 2023-10-13 NOTE — Progress Notes (Deleted)
 S:     No chief complaint on file.   Reason for visit: ?  CAMIYA VINAL is a 63 y.o. female with a history of diabetes (type 2), who presents today for a follow up diabetes pharmacotherapy visit.? Pertinent PMH also includes HLD, HTN, GERD, hx of pancreatitis, and obesity.   Known DM Complications: {DM complications:33329}   Care Team: Primary Care Provider: Gareth Mliss FALCON, FNP  At last visit with clinical pharmacist on 10/07/23,  patient was enrolled in the [facility] program to assist with medication affordability while awaiting Medicaid enrollment. She reported taking Novolog 12 units once daily before bed. Patient was instructed to stop Novolog and start Humalog  at 5 units BID with meals.    Patient reports Diabetes was diagnosed around 63 years of age.   Current diabetes medications include: metformin  500 mg BID with meals, Basaglar  32 units daily, Humalog  5 units BID before meals Current hypertension medications include: lisinopril  10 mg daily Current hyperlipidemia medications include: atorvastatin  80 mg daily  Patient reports adherence to taking all medications as prescribed.  *** Patient denies adherence with medications, reports missing *** medications *** times per week, on average.  Have you been experiencing any side effects to the medications prescribed? {YES NO:22349} Do you have any problems obtaining medications due to transportation or finances? yes Insurance coverage: self-pay; currently enrolling in Medicaid  Patient {Actions; denies-reports:120008} hypoglycemic events.  Reported home fasting blood sugars: ***  Reported 2 hour post-meal/random blood sugars: ***.  Patient {Actions; denies-reports:120008} nocturia (nighttime urination).  Patient {Actions; denies-reports:120008} neuropathy (nerve pain). Patient {Actions; denies-reports:120008} visual changes. Patient {Actions; denies-reports:120008} self foot exams.   Patient reported dietary habits: Eats 2  meals/day  Brunch: biscuit and gravy, hash browns Dinner: tv dinners Snacks: crackers and cheese Drinks: water, grape soda (every other day), coffee *has dentures*   Patient-reported exercise habits: no formal exercise outside of work   DM Prevention:  Statin: Taking; high intensity.?  Last eye exam: due Tobacco Use:   Tobacco Use: High Risk (10/02/2023)   Patient History    Smoking Tobacco Use: Every Day    Smokeless Tobacco Use: Never    Passive Exposure: Current  Hypertension: Patient does not have a validated, automated, upper arm home BP cuff Current blood pressure readings readings: ***  Patient {Actions; denies-reports:120008} hypotensive s/sx including ***dizziness, lightheadedness.  Patient {Actions; denies-reports:120008} hypertensive symptoms including ***headache, chest pain, shortness of breath  O:   Vitals:  Wt Readings from Last 3 Encounters:  10/02/23 179 lb 11.2 oz (81.5 kg)  01/03/23 167 lb 3.2 oz (75.8 kg)  09/16/22 171 lb 6.4 oz (77.7 kg)   BP Readings from Last 3 Encounters:  10/02/23 (!) 152/74  01/03/23 122/76  09/16/22 (!) 151/49   Pulse Readings from Last 3 Encounters:  10/02/23 76  01/03/23 74  09/02/22 70     Labs:?  Lab Results  Component Value Date   HGBA1C 7.1 (A) 01/03/2023   HGBA1C 9.2 (H) 09/02/2022   GLUCOSE 53 (L) 09/02/2022   MICRALBCREAT 51 (H) 09/02/2022   CREATININE 1.03 09/02/2022   CREATININE 1.10 (H) 08/23/2016    Lab Results  Component Value Date   CHOL 162 09/02/2022   LDLCALC 83 09/02/2022   HDL 64 09/02/2022   TRIG 73 09/02/2022   ALT 14 09/02/2022   ALT 18 08/23/2016   AST 20 09/02/2022   AST 28 08/23/2016      Chemistry      Component Value Date/Time  NA 144 09/02/2022 0952   K 3.8 09/02/2022 0952   CL 112 (H) 09/02/2022 0952   CO2 26 09/02/2022 0952   BUN 20 09/02/2022 0952   CREATININE 1.03 09/02/2022 0952      Component Value Date/Time   CALCIUM  9.7 09/02/2022 0952   ALKPHOS 73 08/23/2016  1121   AST 20 09/02/2022 0952   ALT 14 09/02/2022 0952   BILITOT 0.4 09/02/2022 0952       The 10-year ASCVD risk score (Arnett DK, et al., 2019) is: 40.8%  Lab Results  Component Value Date   MICRALBCREAT 51 (H) 09/02/2022    A/P: Diabetes longstanding diagnosis currently uncontrolled with a most recent A1c of 7.1% on 01/03/23, which is down from 9.2% on 09/03/22.  Patient is *** able to verbalize appropriate hypoglycemia management plan. Medication adherence appears ***. Control is suboptimal due to ***. -{Meds adjust:18428} basal insulin  Basaglar  (insulin  glargine) ***  daily in the morning.  -{Meds adjust:18428} rapid insulin  Humalog  (insulin  lispro) from *** to ***.  -{Meds adjust:18428} metformin  ***.  -Patient educated on purpose, proper use, and potential adverse effects of ***.  -Extensively discussed pathophysiology of diabetes, recommended lifestyle interventions, dietary effects on blood sugar control.  -Counseled on s/sx of and management of hypoglycemia.  -Next A1c anticipated ***.   ASCVD risk - primary  prevention in patient with diabetes. Last LDL is 83 not at goal of <70 mg/dL. Will discuss Zetia once Medicaid is obtained.  -Continued atorvastatin  40 mg daily.   Hypertension longstanding currently uncontrolled. Blood pressure goal of <130/80 mmHg. Will attempt to obtain a home BP cuff once insurance is obtained. -Continued lisinopril  5 mg daily.  Patient verbalized understanding of treatment plan. Total time patient counseling *** minutes.  Follow-up:  Pharmacist on ***  Peyton CHARLENA Ferries, PharmD Clinical Pharmacist [facility] [phone]

## 2023-10-14 ENCOUNTER — Ambulatory Visit: Payer: Self-pay

## 2023-10-28 ENCOUNTER — Telehealth: Payer: Self-pay

## 2023-10-28 NOTE — Telephone Encounter (Signed)
   10/28/2023  Monta JONELLE Perch  DOB: 1960-10-20 MRN: [id number]  Attempted to contact patient for medication management/review. Left HIPAA compliant message for patient to return my call at their convenience.   Wilfredo Canterbury E. Marsh, PharmD Clinical Pharmacist [facility] [phone]

## 2023-11-04 NOTE — Progress Notes (Signed)
   11/04/2023  Monta JONELLE Perch  DOB: [birth year] MRN: [id number]  Attempted to contact patient for medication management/review. Left HIPAA compliant message for patient to return my call at their convenience.   Second attempt for patient outreach.  Gabbrielle Mcnicholas E. Marsh, PharmD Clinical Pharmacist [facility] [phone]

## 2023-11-10 NOTE — Telephone Encounter (Signed)
   11/10/2023  Felicia Sherman  DOB: 11-Apr-1960 MRN: [id number]  Attempted to contact patient for medication management/review. Left HIPAA compliant message for patient to return my call at their convenience.   Third attempt for patient outreach.  Naleigha Raimondi E. Marsh, PharmD Clinical Pharmacist [facility] [phone]
